# Patient Record
Sex: Female | Born: 1960 | Race: White | Hispanic: No | Marital: Married | Smoking: Never smoker
Health system: Southern US, Community
[De-identification: ages and names within clinical notes are randomized; demographics above are authoritative.]

## PROBLEM LIST (undated history)

## (undated) DIAGNOSIS — C4491 Basal cell carcinoma of skin, unspecified: Secondary | ICD-10-CM

## (undated) DIAGNOSIS — F32A Depression, unspecified: Secondary | ICD-10-CM

## (undated) DIAGNOSIS — D229 Melanocytic nevi, unspecified: Secondary | ICD-10-CM

## (undated) DIAGNOSIS — C4492 Squamous cell carcinoma of skin, unspecified: Secondary | ICD-10-CM

## (undated) DIAGNOSIS — F419 Anxiety disorder, unspecified: Secondary | ICD-10-CM

## (undated) DIAGNOSIS — C439 Malignant melanoma of skin, unspecified: Secondary | ICD-10-CM

## (undated) HISTORY — DX: Depression, unspecified: F32.A

## (undated) HISTORY — DX: Malignant melanoma of skin, unspecified: C43.9

## (undated) HISTORY — PX: TONSILLECTOMY: SUR1361

## (undated) HISTORY — DX: Basal cell carcinoma of skin, unspecified: C44.91

## (undated) HISTORY — DX: Squamous cell carcinoma of skin, unspecified: C44.92

## (undated) HISTORY — PX: KNEE ARTHROSCOPY W/ MENISCECTOMY: SHX1879

## (undated) HISTORY — DX: Anxiety disorder, unspecified: F41.9

## (undated) HISTORY — DX: Melanocytic nevi, unspecified: D22.9

## (undated) HISTORY — PX: MELANOMA EXCISION: SHX5266

---

## 1999-12-29 ENCOUNTER — Other Ambulatory Visit: Admission: RE | Admit: 1999-12-29 | Discharge: 1999-12-29 | Payer: Self-pay | Admitting: *Deleted

## 2001-02-05 ENCOUNTER — Other Ambulatory Visit: Admission: RE | Admit: 2001-02-05 | Discharge: 2001-02-05 | Payer: Self-pay | Admitting: Obstetrics and Gynecology

## 2002-03-24 DIAGNOSIS — C439 Malignant melanoma of skin, unspecified: Secondary | ICD-10-CM

## 2002-03-24 HISTORY — DX: Malignant melanoma of skin, unspecified: C43.9

## 2002-12-17 ENCOUNTER — Encounter: Admission: RE | Admit: 2002-12-17 | Discharge: 2003-01-08 | Payer: Self-pay | Admitting: Family Medicine

## 2003-07-29 DIAGNOSIS — D229 Melanocytic nevi, unspecified: Secondary | ICD-10-CM

## 2003-07-29 HISTORY — DX: Melanocytic nevi, unspecified: D22.9

## 2010-06-12 ENCOUNTER — Encounter: Payer: Self-pay | Admitting: Family Medicine

## 2013-11-11 DIAGNOSIS — C4492 Squamous cell carcinoma of skin, unspecified: Secondary | ICD-10-CM

## 2013-11-11 HISTORY — DX: Squamous cell carcinoma of skin, unspecified: C44.92

## 2016-01-14 DIAGNOSIS — C4491 Basal cell carcinoma of skin, unspecified: Secondary | ICD-10-CM

## 2016-01-14 HISTORY — DX: Basal cell carcinoma of skin, unspecified: C44.91

## 2016-05-23 DIAGNOSIS — F3160 Bipolar disorder, current episode mixed, unspecified: Secondary | ICD-10-CM | POA: Diagnosis not present

## 2016-05-26 DIAGNOSIS — L570 Actinic keratosis: Secondary | ICD-10-CM | POA: Diagnosis not present

## 2016-06-09 DIAGNOSIS — F4323 Adjustment disorder with mixed anxiety and depressed mood: Secondary | ICD-10-CM | POA: Diagnosis not present

## 2016-06-09 DIAGNOSIS — F332 Major depressive disorder, recurrent severe without psychotic features: Secondary | ICD-10-CM | POA: Diagnosis not present

## 2016-06-12 DIAGNOSIS — F332 Major depressive disorder, recurrent severe without psychotic features: Secondary | ICD-10-CM | POA: Diagnosis not present

## 2016-06-26 DIAGNOSIS — F332 Major depressive disorder, recurrent severe without psychotic features: Secondary | ICD-10-CM | POA: Diagnosis not present

## 2016-06-30 DIAGNOSIS — L910 Hypertrophic scar: Secondary | ICD-10-CM | POA: Diagnosis not present

## 2016-06-30 DIAGNOSIS — Z85820 Personal history of malignant melanoma of skin: Secondary | ICD-10-CM | POA: Diagnosis not present

## 2016-06-30 DIAGNOSIS — Z85828 Personal history of other malignant neoplasm of skin: Secondary | ICD-10-CM | POA: Diagnosis not present

## 2016-06-30 DIAGNOSIS — L570 Actinic keratosis: Secondary | ICD-10-CM | POA: Diagnosis not present

## 2016-07-20 DIAGNOSIS — F332 Major depressive disorder, recurrent severe without psychotic features: Secondary | ICD-10-CM | POA: Diagnosis not present

## 2016-07-26 DIAGNOSIS — F332 Major depressive disorder, recurrent severe without psychotic features: Secondary | ICD-10-CM | POA: Diagnosis not present

## 2016-09-15 DIAGNOSIS — F332 Major depressive disorder, recurrent severe without psychotic features: Secondary | ICD-10-CM | POA: Diagnosis not present

## 2017-01-17 DIAGNOSIS — Z01419 Encounter for gynecological examination (general) (routine) without abnormal findings: Secondary | ICD-10-CM | POA: Diagnosis not present

## 2017-02-06 DIAGNOSIS — F332 Major depressive disorder, recurrent severe without psychotic features: Secondary | ICD-10-CM | POA: Diagnosis not present

## 2017-02-08 DIAGNOSIS — F332 Major depressive disorder, recurrent severe without psychotic features: Secondary | ICD-10-CM | POA: Diagnosis not present

## 2017-02-09 DIAGNOSIS — F332 Major depressive disorder, recurrent severe without psychotic features: Secondary | ICD-10-CM | POA: Diagnosis not present

## 2017-02-14 DIAGNOSIS — F332 Major depressive disorder, recurrent severe without psychotic features: Secondary | ICD-10-CM | POA: Diagnosis not present

## 2017-03-23 DIAGNOSIS — F332 Major depressive disorder, recurrent severe without psychotic features: Secondary | ICD-10-CM | POA: Diagnosis not present

## 2017-04-02 DIAGNOSIS — F332 Major depressive disorder, recurrent severe without psychotic features: Secondary | ICD-10-CM | POA: Diagnosis not present

## 2017-04-25 DIAGNOSIS — F332 Major depressive disorder, recurrent severe without psychotic features: Secondary | ICD-10-CM | POA: Diagnosis not present

## 2017-06-20 DIAGNOSIS — F332 Major depressive disorder, recurrent severe without psychotic features: Secondary | ICD-10-CM | POA: Diagnosis not present

## 2017-08-07 DIAGNOSIS — L570 Actinic keratosis: Secondary | ICD-10-CM | POA: Diagnosis not present

## 2017-08-07 DIAGNOSIS — D229 Melanocytic nevi, unspecified: Secondary | ICD-10-CM | POA: Diagnosis not present

## 2017-08-07 DIAGNOSIS — Z85820 Personal history of malignant melanoma of skin: Secondary | ICD-10-CM | POA: Diagnosis not present

## 2017-08-21 DIAGNOSIS — F410 Panic disorder [episodic paroxysmal anxiety] without agoraphobia: Secondary | ICD-10-CM | POA: Diagnosis not present

## 2017-08-22 DIAGNOSIS — F410 Panic disorder [episodic paroxysmal anxiety] without agoraphobia: Secondary | ICD-10-CM | POA: Diagnosis not present

## 2017-10-09 DIAGNOSIS — H524 Presbyopia: Secondary | ICD-10-CM | POA: Diagnosis not present

## 2017-10-10 ENCOUNTER — Other Ambulatory Visit: Payer: Self-pay

## 2017-10-10 ENCOUNTER — Encounter (HOSPITAL_COMMUNITY): Payer: Self-pay | Admitting: Emergency Medicine

## 2017-10-10 ENCOUNTER — Emergency Department (HOSPITAL_COMMUNITY)
Admission: EM | Admit: 2017-10-10 | Discharge: 2017-10-10 | Disposition: A | Discharge status: Home / Self Care | Payer: BLUE CROSS/BLUE SHIELD | Attending: Emergency Medicine | Admitting: Emergency Medicine

## 2017-10-10 ENCOUNTER — Emergency Department (HOSPITAL_COMMUNITY): Payer: BLUE CROSS/BLUE SHIELD

## 2017-10-10 DIAGNOSIS — Z79899 Other long term (current) drug therapy: Secondary | ICD-10-CM | POA: Diagnosis not present

## 2017-10-10 DIAGNOSIS — M545 Low back pain, unspecified: Secondary | ICD-10-CM

## 2017-10-10 DIAGNOSIS — R109 Unspecified abdominal pain: Secondary | ICD-10-CM | POA: Diagnosis not present

## 2017-10-10 DIAGNOSIS — R1030 Lower abdominal pain, unspecified: Secondary | ICD-10-CM | POA: Diagnosis not present

## 2017-10-10 DIAGNOSIS — R1031 Right lower quadrant pain: Secondary | ICD-10-CM | POA: Insufficient documentation

## 2017-10-10 LAB — BASIC METABOLIC PANEL
ANION GAP: 10 (ref 5–15)
BUN: 21 mg/dL — AB (ref 6–20)
CHLORIDE: 104 mmol/L (ref 101–111)
CO2: 25 mmol/L (ref 22–32)
Calcium: 9.5 mg/dL (ref 8.9–10.3)
Creatinine, Ser: 0.89 mg/dL (ref 0.44–1.00)
GFR calc Af Amer: >60 mL/min (ref >60)
GFR calc non Af Amer: >60 mL/min (ref >60)
GLUCOSE: 105 mg/dL — AB (ref 65–99)
POTASSIUM: 4.1 mmol/L (ref 3.5–5.1)
SODIUM: 139 mmol/L (ref 135–145)

## 2017-10-10 LAB — URINALYSIS, ROUTINE W REFLEX MICROSCOPIC
Bilirubin Urine: NEGATIVE
GLUCOSE, UA: NEGATIVE mg/dL
Hgb urine dipstick: NEGATIVE
Ketones, ur: 20 mg/dL — AB
LEUKOCYTES UA: NEGATIVE
Nitrite: NEGATIVE
PROTEIN: NEGATIVE mg/dL
SPECIFIC GRAVITY, URINE: 1.024 (ref 1.005–1.030)
pH: 5 (ref 5.0–8.0)

## 2017-10-10 LAB — CBC
HEMATOCRIT: 40.6 % (ref 36.0–46.0)
HEMOGLOBIN: 14 g/dL (ref 12.0–15.0)
MCH: 29.9 pg (ref 26.0–34.0)
MCHC: 34.5 g/dL (ref 30.0–36.0)
MCV: 86.8 fL (ref 78.0–100.0)
Platelets: 221 10*3/uL (ref 150–400)
RBC: 4.68 MIL/uL (ref 3.87–5.11)
RDW: 12.4 % (ref 11.5–15.5)
WBC: 8.4 10*3/uL (ref 4.0–10.5)

## 2017-10-10 LAB — HEPATIC FUNCTION PANEL
ALT: 19 U/L (ref 14–54)
AST: 20 U/L (ref 15–41)
Albumin: 4.4 g/dL (ref 3.5–5.0)
Alkaline Phosphatase: 53 U/L (ref 38–126)
Bilirubin, Direct: <0.1 mg/dL — ABNORMAL LOW (ref 0.1–0.5)
Total Bilirubin: 0.9 mg/dL (ref 0.3–1.2)
Total Protein: 7.1 g/dL (ref 6.5–8.1)

## 2017-10-10 LAB — LIPASE, BLOOD: Lipase: 25 U/L (ref 11–51)

## 2017-10-10 MED ORDER — KETOROLAC TROMETHAMINE 30 MG/ML IJ SOLN
30.0000 mg | Freq: Once | INTRAMUSCULAR | Status: AC
  Administered 2017-10-10: 30 mg via INTRAVENOUS
  Filled 2017-10-10: qty 1

## 2017-10-10 MED ORDER — IOPAMIDOL (ISOVUE-300) INJECTION 61%
100.0000 mL | Freq: Once | INTRAVENOUS | Status: AC | PRN
  Administered 2017-10-10: 100 mL via INTRAVENOUS

## 2017-10-10 MED ORDER — IOPAMIDOL (ISOVUE-300) INJECTION 61%
INTRAVENOUS | Status: AC
  Filled 2017-10-10: qty 100

## 2017-10-10 MED ORDER — IBUPROFEN 800 MG PO TABS: 800.0000 mg | ORAL_TABLET | Freq: Three times a day (TID) | ORAL | 0 refills | Status: DC | PRN

## 2017-10-10 MED ORDER — CYCLOBENZAPRINE HCL 10 MG PO TABS: 10.0000 mg | ORAL_TABLET | Freq: Every day | ORAL | 0 refills | Status: DC

## 2017-10-10 MED ORDER — TRAMADOL HCL 50 MG PO TABS: 50.0000 mg | ORAL_TABLET | Freq: Four times a day (QID) | ORAL | 0 refills | Status: DC | PRN

## 2017-10-10 NOTE — ED Provider Notes (Signed)
[facility] DEPT Provider Note   CSN: [id number] Arrival date & time: 10/10/17  1223     History   Chief Complaint Chief Complaint  Patient presents with  . Flank Pain    HPI Sheri Holland is a 57 y.o. female.  HPI Patient presents to the emergency department with right lower back and flank pain.  The patient states that the pain started last night and got worse throughout the evening and into the day today.  The patient states she took a muscle relaxant and a hydrocodone with some relief of her symptoms.  The patient denies chest pain, shortness of breath, headache,blurred vision, neck pain, fever, cough, weakness, numbness, dizziness, anorexia, edema, abdominal pain, nausea, vomiting, diarrhea, rash, dysuria, hematemesis, bloody stool, near syncope, or syncope. History reviewed. No pertinent past medical history.  There are no active problems to display for this patient.   History reviewed. No pertinent surgical history.   OB History   None      Home Medications    Prior to Admission medications   Medication Sig Start Date End Date Taking? Authorizing Provider  Multiple Vitamins-Calcium (ONE-A-DAY WOMENS PO) Take 1 tablet by mouth daily.   Yes [provider]  Omega-3 Fatty Acids (FISH OIL PO) Take 1 capsule by mouth daily.   Yes [provider]  venlafaxine XR (EFFEXOR-XR) 75 MG 24 hr capsule Take 75 mg by mouth daily. 09/10/17  Yes [provider]  LORazepam (ATIVAN) 0.5 MG tablet Take 0.25-0.5 tablets by mouth daily as needed for anxiety.  09/10/17   [provider]    Family History No family history on file.  Social History Social History   Tobacco Use  . Smoking status: Not on file  Substance Use Topics  . Alcohol use: Not on file  . Drug use: Not on file     Allergies   Patient has no known allergies.   Review of Systems Review of Systems All other systems negative except as  documented in the HPI. All pertinent positives and negatives as reviewed in the HPI.  Physical Exam Updated Vital Signs BP 131/79 (BP Location: Right Arm)   Pulse (!) 48   Temp 98 F (36.7 C) (Oral)   Resp 16   SpO2 100%   Physical Exam  Constitutional: She is oriented to person, place, and time. She appears well-developed and well-nourished. No distress.  HENT:  Head: Normocephalic and atraumatic.  Mouth/Throat: Oropharynx is clear and moist.  Eyes: Pupils are equal, round, and reactive to light.  Neck: Normal range of motion. Neck supple.  Cardiovascular: Normal rate, regular rhythm and normal heart sounds. Exam reveals no gallop and no friction rub.  No murmur heard. Pulmonary/Chest: Effort normal and breath sounds normal. No respiratory distress. She has no wheezes.  Abdominal: Soft. Bowel sounds are normal. She exhibits no distension and no mass. There is tenderness. There is no rebound and no guarding.    Neurological: She is alert and oriented to person, place, and time. She exhibits normal muscle tone. Coordination normal.  Skin: Skin is warm and dry. Capillary refill takes less than 2 seconds. No rash noted. No erythema.  Psychiatric: She has a normal mood and affect. Her behavior is normal.  Nursing note and vitals reviewed.    ED Treatments / Results  Labs (all labs ordered are listed, but only abnormal results are displayed) Labs Reviewed  URINALYSIS, ROUTINE W REFLEX MICROSCOPIC - Abnormal; Notable for the following  components:      Result Value   Ketones, ur 20 (*)    All other components within normal limits  BASIC METABOLIC PANEL - Abnormal; Notable for the following components:   Glucose, Bld 105 (*)    BUN 21 (*)    All other components within normal limits  HEPATIC FUNCTION PANEL - Abnormal; Notable for the following components:   Bilirubin, Direct <0.1 (*)    All other components within normal limits  CBC  LIPASE, BLOOD     EKG None  Radiology Ct Abdomen Pelvis W Contrast  Result Date: 10/10/2017 CLINICAL DATA:  Right-sided flank pain. EXAM: CT ABDOMEN AND PELVIS WITH CONTRAST TECHNIQUE: Multidetector CT imaging of the abdomen and pelvis was performed using the standard protocol following bolus administration of intravenous contrast. CONTRAST:  168mL ISOVUE-300 IOPAMIDOL (ISOVUE-300) INJECTION 61% COMPARISON:  None. FINDINGS: Lower chest: No acute abnormality. Hepatobiliary: No focal liver abnormality is seen. No gallstones, gallbladder wall thickening, or biliary dilatation. Pancreas: Unremarkable. No pancreatic ductal dilatation or surrounding inflammatory changes. Spleen: Normal in size without focal abnormality. Adrenals/Urinary Tract: Adrenal glands are unremarkable. Kidneys are normal, without renal calculi, focal lesion, or hydronephrosis. Bladder is unremarkable. Stomach/Bowel: Bowel shows no evidence of obstruction or inflammation. No bowel lesions identified. No free air or abscess. Vascular/Lymphatic: No significant vascular findings are present. No enlarged abdominal or pelvic lymph nodes. Reproductive: Uterus and bilateral adnexa are unremarkable. There are prominent pelvic varicosities in the left pelvis and adnexal region that appear to communicate with an enlarged left ovarian vein. This is nonspecific but can be indicative of incompetence of the left ovarian vein and can be a cause of chronic pelvic pain due to pelvic varicosities. Other: No abdominal wall hernia or abnormality. No abdominopelvic ascites. Musculoskeletal: No acute or significant osseous findings. IMPRESSION: 1. No acute findings. 2. 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 Electronically Signed   By: Aletta Edouard M.D.   On: 10/10/2017 17:57    Procedures Procedures (including critical care time)  Medications Ordered in ED Medications  iopamidol (ISOVUE-300)  61 % injection 100 mL (100 mLs Intravenous Contrast Given 10/10/17 1735)     Initial Impression / Assessment and Plan / ED Course  I have reviewed the triage vital signs and the nursing notes.  Pertinent labs & imaging results that were available during my care of the patient were reviewed by me and considered in my medical decision making (see chart for details).     Patient will be treated for lower back discomfort.  I explained to the patient this is something that could be evolving and is yet to fully declare itself on the CT scan.  The patient is advised to return for any worsening in her condition patient agrees the plan and all questions were answered. Final Clinical Impressions(s) / ED Diagnoses   Final diagnoses:  None    ED Discharge Orders    None       Rebeca Allegra 10/10/17 Rowan Blase, MD 10/11/17 [phone]

## 2017-10-10 NOTE — Discharge Instructions (Addendum)
He can use ice and heat on your lower back.  Return here for any worsening in your condition as this could be an evolving process that is yet to fully declare itself.

## 2017-10-10 NOTE — ED Triage Notes (Signed)
Pt complaint of right flank pain onset last night; denies GU symptoms; denies radiating down leg.

## 2017-10-10 NOTE — ED Notes (Signed)
1x unsuccessful IV attempt

## 2017-10-22 DIAGNOSIS — M9903 Segmental and somatic dysfunction of lumbar region: Secondary | ICD-10-CM | POA: Diagnosis not present

## 2017-10-22 DIAGNOSIS — M9905 Segmental and somatic dysfunction of pelvic region: Secondary | ICD-10-CM | POA: Diagnosis not present

## 2017-10-22 DIAGNOSIS — M9904 Segmental and somatic dysfunction of sacral region: Secondary | ICD-10-CM | POA: Diagnosis not present

## 2017-10-22 DIAGNOSIS — S332XXA Dislocation of sacroiliac and sacrococcygeal joint, initial encounter: Secondary | ICD-10-CM | POA: Diagnosis not present

## 2017-10-29 DIAGNOSIS — M9903 Segmental and somatic dysfunction of lumbar region: Secondary | ICD-10-CM | POA: Diagnosis not present

## 2017-10-29 DIAGNOSIS — M9905 Segmental and somatic dysfunction of pelvic region: Secondary | ICD-10-CM | POA: Diagnosis not present

## 2017-10-29 DIAGNOSIS — S332XXA Dislocation of sacroiliac and sacrococcygeal joint, initial encounter: Secondary | ICD-10-CM | POA: Diagnosis not present

## 2017-10-29 DIAGNOSIS — M9904 Segmental and somatic dysfunction of sacral region: Secondary | ICD-10-CM | POA: Diagnosis not present

## 2017-11-01 DIAGNOSIS — S332XXA Dislocation of sacroiliac and sacrococcygeal joint, initial encounter: Secondary | ICD-10-CM | POA: Diagnosis not present

## 2017-11-01 DIAGNOSIS — M9905 Segmental and somatic dysfunction of pelvic region: Secondary | ICD-10-CM | POA: Diagnosis not present

## 2017-11-01 DIAGNOSIS — M9903 Segmental and somatic dysfunction of lumbar region: Secondary | ICD-10-CM | POA: Diagnosis not present

## 2017-11-01 DIAGNOSIS — M9904 Segmental and somatic dysfunction of sacral region: Secondary | ICD-10-CM | POA: Diagnosis not present

## 2018-02-07 DIAGNOSIS — F410 Panic disorder [episodic paroxysmal anxiety] without agoraphobia: Secondary | ICD-10-CM | POA: Diagnosis not present

## 2018-02-08 DIAGNOSIS — Z01419 Encounter for gynecological examination (general) (routine) without abnormal findings: Secondary | ICD-10-CM | POA: Diagnosis not present

## 2018-02-08 DIAGNOSIS — Z1231 Encounter for screening mammogram for malignant neoplasm of breast: Secondary | ICD-10-CM | POA: Diagnosis not present

## 2018-02-08 DIAGNOSIS — Z6823 Body mass index (BMI) 23.0-23.9, adult: Secondary | ICD-10-CM | POA: Diagnosis not present

## 2018-02-20 ENCOUNTER — Ambulatory Visit: Payer: Self-pay | Admitting: Mental Health

## 2018-04-09 ENCOUNTER — Other Ambulatory Visit: Payer: Self-pay

## 2018-04-09 MED ORDER — VENLAFAXINE HCL ER 75 MG PO CP24: 75.0000 mg | ORAL_CAPSULE | Freq: Every day | ORAL | 1 refills | Status: DC

## 2018-05-23 DIAGNOSIS — M25561 Pain in right knee: Secondary | ICD-10-CM | POA: Diagnosis not present

## 2018-05-23 DIAGNOSIS — G8929 Other chronic pain: Secondary | ICD-10-CM | POA: Diagnosis not present

## 2018-05-23 DIAGNOSIS — M2391 Unspecified internal derangement of right knee: Secondary | ICD-10-CM | POA: Diagnosis not present

## 2018-05-24 ENCOUNTER — Other Ambulatory Visit: Payer: Self-pay | Admitting: Orthopedic Surgery

## 2018-05-24 DIAGNOSIS — M25561 Pain in right knee: Secondary | ICD-10-CM

## 2018-06-03 ENCOUNTER — Ambulatory Visit
Admission: RE | Admit: 2018-06-03 | Discharge: 2018-06-03 | Disposition: A | Discharge status: Home / Self Care | Payer: BLUE CROSS/BLUE SHIELD | Source: Ambulatory Visit | Attending: Orthopedic Surgery | Admitting: Orthopedic Surgery

## 2018-06-03 DIAGNOSIS — S83241A Other tear of medial meniscus, current injury, right knee, initial encounter: Secondary | ICD-10-CM | POA: Diagnosis not present

## 2018-06-03 DIAGNOSIS — M25561 Pain in right knee: Secondary | ICD-10-CM

## 2018-06-04 DIAGNOSIS — S83241A Other tear of medial meniscus, current injury, right knee, initial encounter: Secondary | ICD-10-CM | POA: Diagnosis not present

## 2018-06-04 DIAGNOSIS — G8929 Other chronic pain: Secondary | ICD-10-CM | POA: Diagnosis not present

## 2018-07-03 DIAGNOSIS — S83231A Complex tear of medial meniscus, current injury, right knee, initial encounter: Secondary | ICD-10-CM | POA: Diagnosis not present

## 2018-07-03 DIAGNOSIS — G8918 Other acute postprocedural pain: Secondary | ICD-10-CM | POA: Diagnosis not present

## 2018-07-03 DIAGNOSIS — X58XXXA Exposure to other specified factors, initial encounter: Secondary | ICD-10-CM | POA: Diagnosis not present

## 2018-07-03 DIAGNOSIS — Y999 Unspecified external cause status: Secondary | ICD-10-CM | POA: Diagnosis not present

## 2018-07-03 DIAGNOSIS — M94261 Chondromalacia, right knee: Secondary | ICD-10-CM | POA: Diagnosis not present

## 2018-07-03 DIAGNOSIS — M659 Synovitis and tenosynovitis, unspecified: Secondary | ICD-10-CM | POA: Diagnosis not present

## 2018-07-03 DIAGNOSIS — M6751 Plica syndrome, right knee: Secondary | ICD-10-CM | POA: Diagnosis not present

## 2018-07-09 DIAGNOSIS — M25561 Pain in right knee: Secondary | ICD-10-CM | POA: Diagnosis not present

## 2018-07-09 DIAGNOSIS — R262 Difficulty in walking, not elsewhere classified: Secondary | ICD-10-CM | POA: Diagnosis not present

## 2018-07-09 DIAGNOSIS — S83231A Complex tear of medial meniscus, current injury, right knee, initial encounter: Secondary | ICD-10-CM | POA: Diagnosis not present

## 2018-07-09 DIAGNOSIS — Z7409 Other reduced mobility: Secondary | ICD-10-CM | POA: Diagnosis not present

## 2018-07-18 DIAGNOSIS — Z7409 Other reduced mobility: Secondary | ICD-10-CM | POA: Diagnosis not present

## 2018-07-18 DIAGNOSIS — M25561 Pain in right knee: Secondary | ICD-10-CM | POA: Diagnosis not present

## 2018-07-18 DIAGNOSIS — S83231A Complex tear of medial meniscus, current injury, right knee, initial encounter: Secondary | ICD-10-CM | POA: Diagnosis not present

## 2018-07-18 DIAGNOSIS — R262 Difficulty in walking, not elsewhere classified: Secondary | ICD-10-CM | POA: Diagnosis not present

## 2018-07-23 DIAGNOSIS — D229 Melanocytic nevi, unspecified: Secondary | ICD-10-CM | POA: Diagnosis not present

## 2018-07-23 DIAGNOSIS — L570 Actinic keratosis: Secondary | ICD-10-CM | POA: Diagnosis not present

## 2018-07-25 ENCOUNTER — Ambulatory Visit: Payer: Self-pay | Admitting: Psychiatry

## 2018-07-30 DIAGNOSIS — C4372 Malignant melanoma of left lower limb, including hip: Secondary | ICD-10-CM | POA: Diagnosis not present

## 2018-07-30 DIAGNOSIS — Z9889 Other specified postprocedural states: Secondary | ICD-10-CM | POA: Diagnosis not present

## 2018-07-30 DIAGNOSIS — F330 Major depressive disorder, recurrent, mild: Secondary | ICD-10-CM | POA: Diagnosis not present

## 2018-07-30 DIAGNOSIS — Z0000 Encounter for general adult medical examination without abnormal findings: Secondary | ICD-10-CM | POA: Diagnosis not present

## 2018-07-31 DIAGNOSIS — M25561 Pain in right knee: Secondary | ICD-10-CM | POA: Diagnosis not present

## 2018-07-31 DIAGNOSIS — S83231A Complex tear of medial meniscus, current injury, right knee, initial encounter: Secondary | ICD-10-CM | POA: Diagnosis not present

## 2018-07-31 DIAGNOSIS — Z7409 Other reduced mobility: Secondary | ICD-10-CM | POA: Diagnosis not present

## 2018-07-31 DIAGNOSIS — R262 Difficulty in walking, not elsewhere classified: Secondary | ICD-10-CM | POA: Diagnosis not present

## 2018-08-20 ENCOUNTER — Encounter: Payer: Self-pay | Admitting: *Deleted

## 2018-08-28 ENCOUNTER — Ambulatory Visit: Payer: Self-pay | Admitting: Psychiatry

## 2018-09-13 IMAGING — CT CT ABD-PELV W/ CM
2 of 5 series · 16 of 46 positions shown, 18 images · IV contrast (iopamidol)
Comparison: None.

CLINICAL DATA: Right-sided flank pain.

EXAM:
CT ABDOMEN AND PELVIS WITH CONTRAST
TECHNIQUE: Multidetector CT imaging of the abdomen and pelvis was performed
using the standard protocol following bolus administration of
intravenous contrast.
CONTRAST:  100mL JVZADG-4JJ IOPAMIDOL (JVZADG-4JJ) INJECTION 61%

[Series 2: axial st · axial · 0.76mm/px · z∈[-732,-377]mm · 13 of 83 slices shown, 15 images]
[im 6/83  soft-tissue]
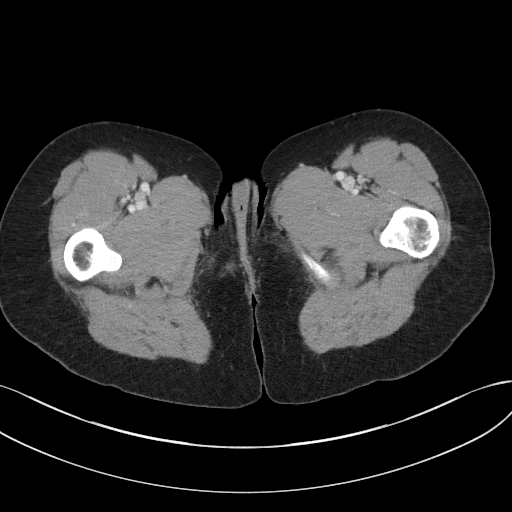
[im 6/83  bone]
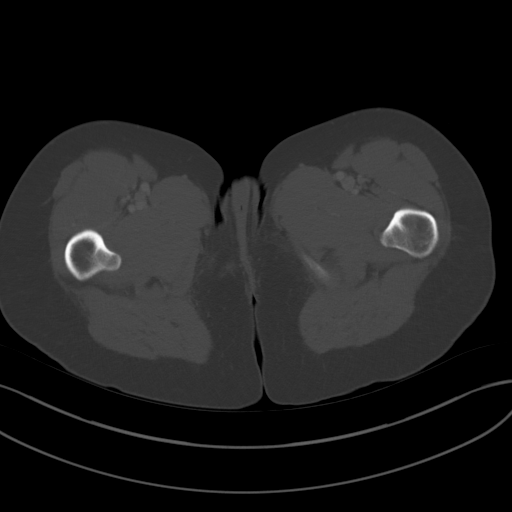
[im 12/83  soft-tissue]
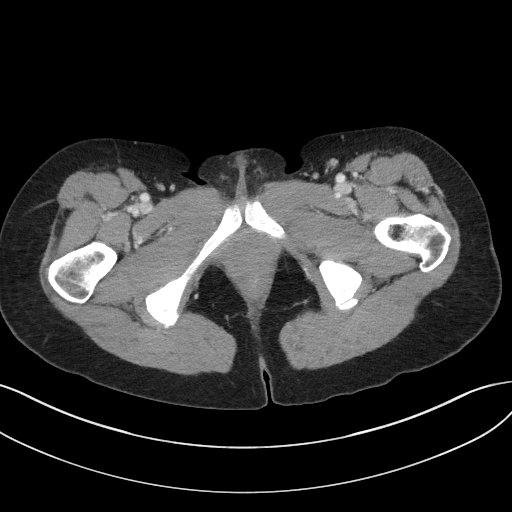
[im 18/83  soft-tissue]
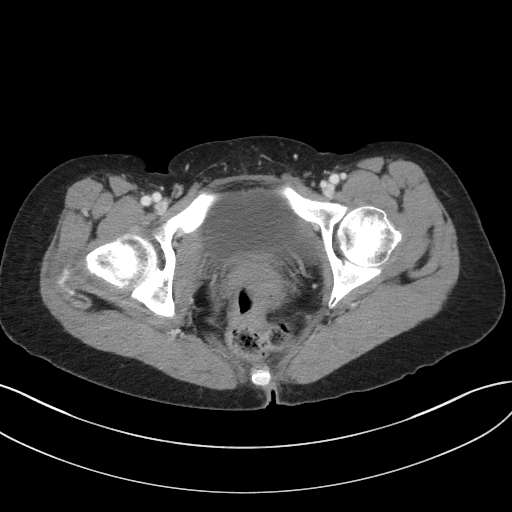
[im 24/83  soft-tissue]
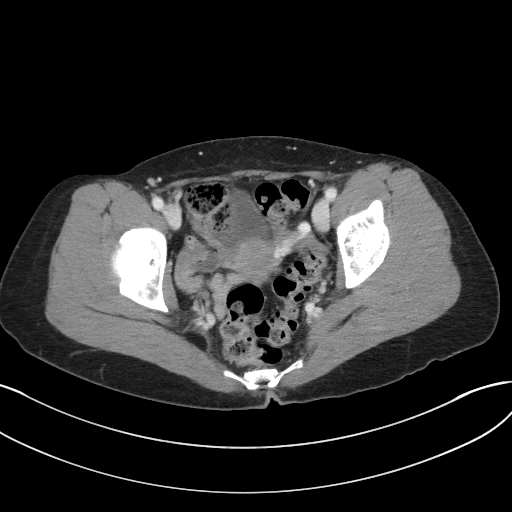
[im 30/83  soft-tissue]
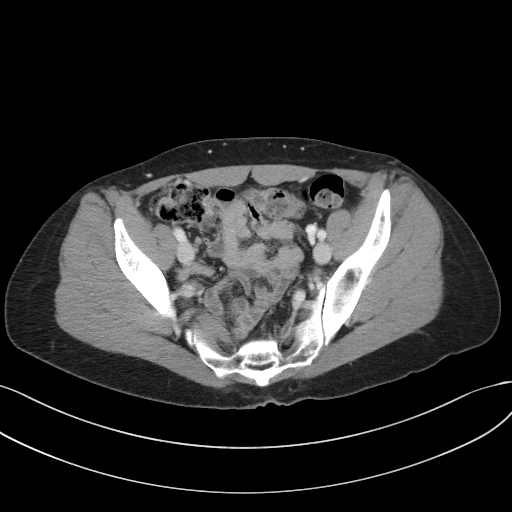
[im 36/83  soft-tissue]
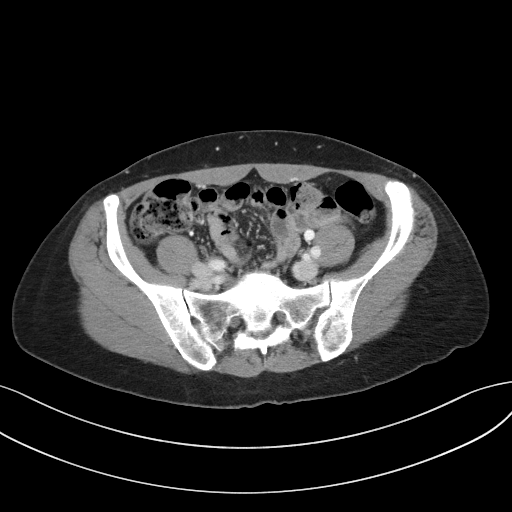
[im 42/83  soft-tissue]
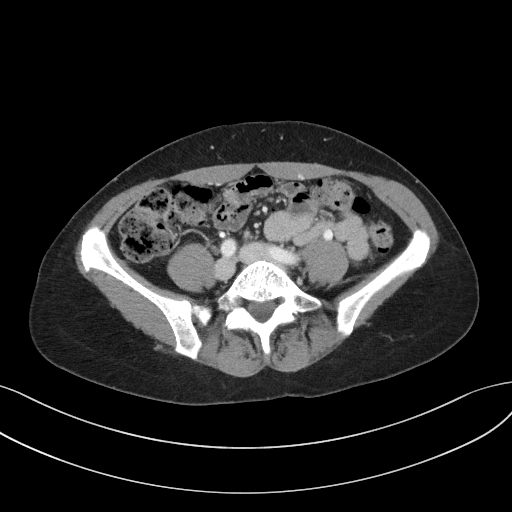
[im 47/83  soft-tissue]
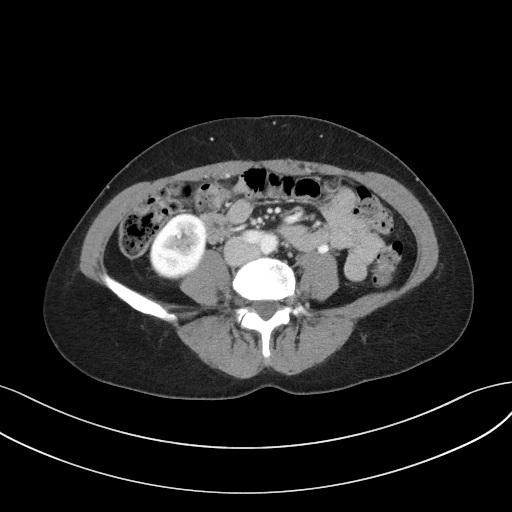
[im 53/83  soft-tissue]
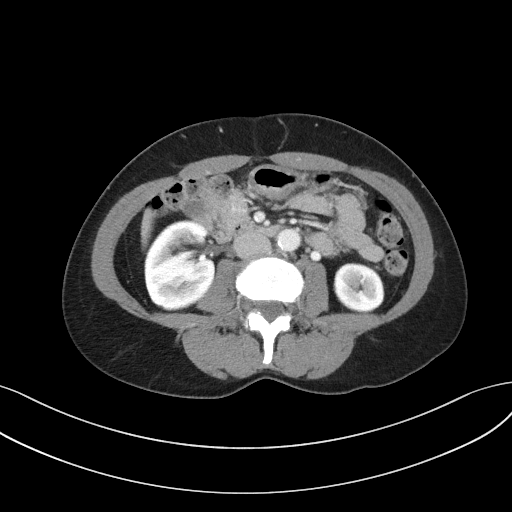
[im 53/83  bone]
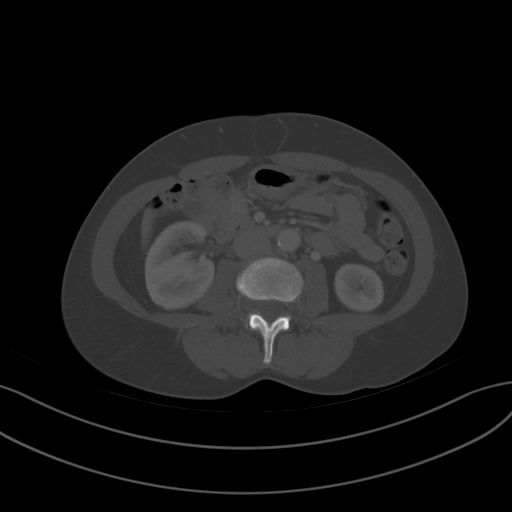
[im 59/83  soft-tissue]
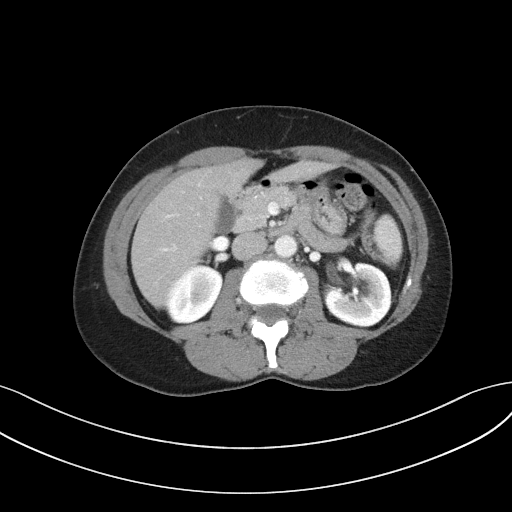
[im 65/83  soft-tissue]
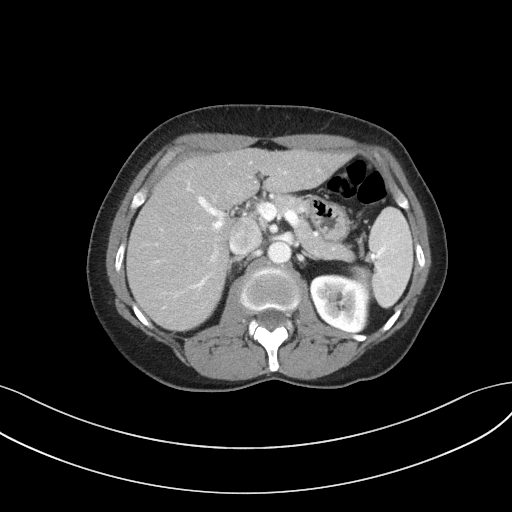
[im 71/83  soft-tissue]
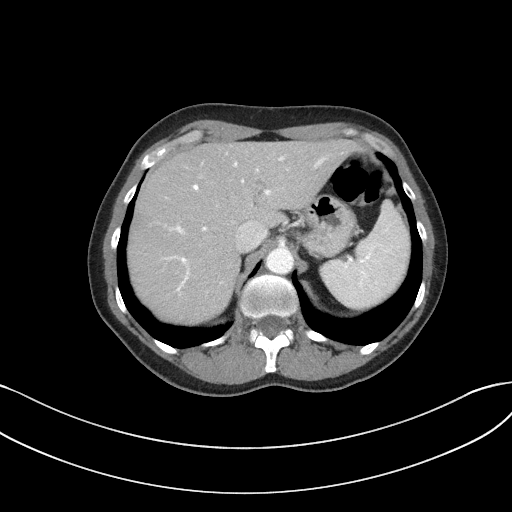
[im 77/83  soft-tissue]
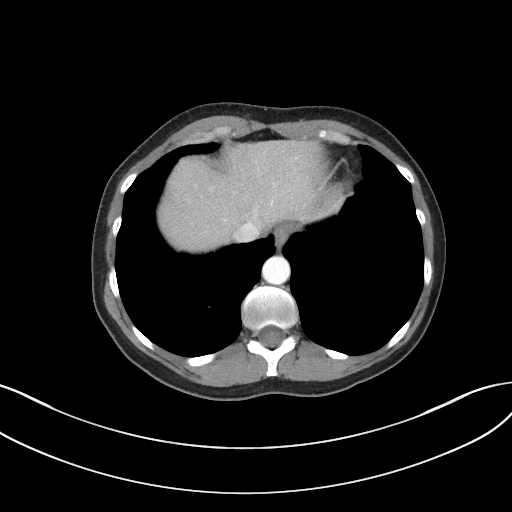

[Series 5: coronal st · coronal · 0.68mm/px · 3 of 101 slices shown]
[im 34/101  soft-tissue]
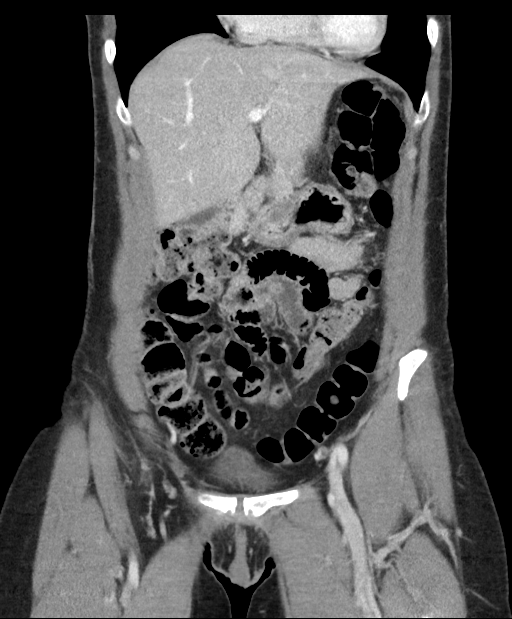
[im 45/101  soft-tissue]
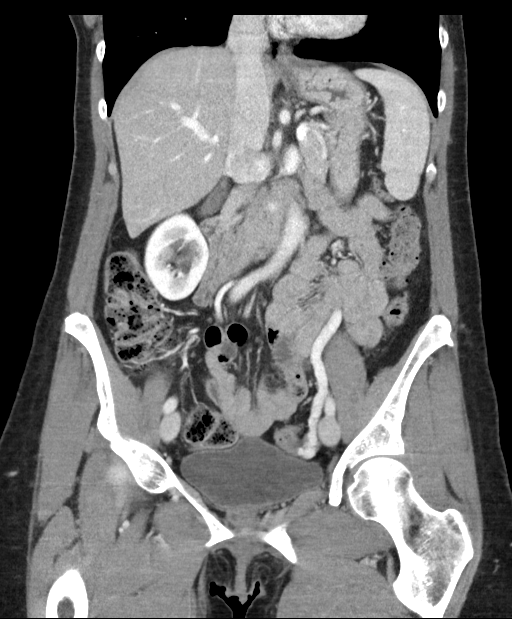
[im 56/101  soft-tissue]
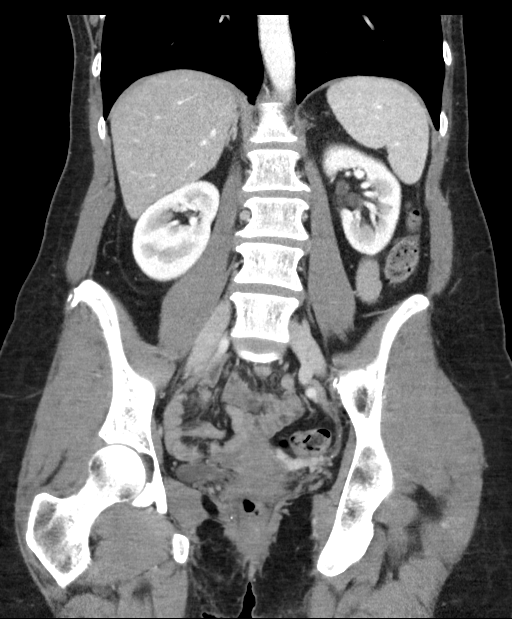

[16 of 46 positions shown; findings below may reference images not displayed]

FINDINGS: Lower chest: No acute abnormality.

Hepatobiliary: No focal liver abnormality is seen. No gallstones,
gallbladder wall thickening, or biliary dilatation.

Pancreas: Unremarkable. No pancreatic ductal dilatation or
surrounding inflammatory changes.

Spleen: Normal in size without focal abnormality.

Adrenals/Urinary Tract: Adrenal glands are unremarkable. Kidneys are
normal, without renal calculi, focal lesion, or hydronephrosis.
Bladder is unremarkable.

Stomach/Bowel: Bowel shows no evidence of obstruction or
inflammation. No bowel lesions identified. No free air or abscess.

Vascular/Lymphatic: No significant vascular findings are present. No
enlarged abdominal or pelvic lymph nodes.

Reproductive: Uterus and bilateral adnexa are unremarkable. There
are prominent pelvic varicosities in the left pelvis and adnexal
region that appear to communicate with an enlarged left ovarian
vein. This is nonspecific but can be indicative of incompetence of
the left ovarian vein and can be a cause of chronic pelvic pain due
to pelvic varicosities.

Other: No abdominal wall hernia or abnormality. No abdominopelvic
ascites.

Musculoskeletal: No acute or significant osseous findings.
IMPRESSION: 1. No acute findings.
2. Prominent left pelvic veins with enlarged left ovarian vein can
be indicative of left ovarian vein incompetence.

## 2018-09-19 ENCOUNTER — Encounter: Payer: Self-pay | Admitting: Psychiatry

## 2018-09-19 ENCOUNTER — Ambulatory Visit (INDEPENDENT_AMBULATORY_CARE_PROVIDER_SITE_OTHER): Payer: BLUE CROSS/BLUE SHIELD | Admitting: Psychiatry

## 2018-09-19 ENCOUNTER — Other Ambulatory Visit: Payer: Self-pay

## 2018-09-19 DIAGNOSIS — F330 Major depressive disorder, recurrent, mild: Secondary | ICD-10-CM

## 2018-09-19 DIAGNOSIS — F411 Generalized anxiety disorder: Secondary | ICD-10-CM | POA: Diagnosis not present

## 2018-09-19 MED ORDER — VENLAFAXINE HCL ER 75 MG PO CP24: 75.0000 mg | ORAL_CAPSULE | Freq: Every day | ORAL | 2 refills | Status: DC

## 2018-09-19 NOTE — Progress Notes (Signed)
Sheri Holland [id number] 1961/03/19 58 y.o.  Virtual Visit via Video Note  I connected with@ on 09/19/18 at  1:30 PM EDT by a video enabled telemedicine application and verified that I am speaking with the correct person using two identifiers.   I discussed the limitations of evaluation and management by telemedicine and the availability of in person appointments. The patient expressed understanding and agreed to proceed.  I discussed the assessment and treatment plan with the patient. The patient was provided an opportunity to ask questions and all were answered. The patient agreed with the plan and demonstrated an understanding of the instructions.   The patient was advised to call back or seek an in-person evaluation if the symptoms worsen or if the condition fails to improve as anticipated.  I provided 30 minutes of non-face-to-face time during this encounter.  The patient was located at home.  The provider was located at home.   Thayer Headings, PMHNP   Subjective:   Patient ID:  Sheri Holland is a 58 y.o. (DOB Dec 15, 1960) female.  Chief Complaint:  Chief Complaint  Patient presents with  . Follow-up    h/o anxiety and depression    HPI Sheri Holland presents to the office today for follow-up of depression and anxiety. She reports that she is "up and down." She reports that she has been taking Effexor XR 75 mg po qd and that this is effective for her. She reports some worry and rumination. She reports feelings of guilt and shame.   She reports that if she misses a dose of Effexor XR she has some vivid dreams. She reports that overall her mood is "ok." She reports that she continues to have some psychosocial stress and is reading some materials about boundaries. She reports that her anxiety is manageable and occasionally and that Ativan is typically effective. She reports that when she has increased anxiety it is in response to identifiable stressors. She denies any recent  panic attacks. She reports that she has been sleeping ok. She reports vivid dreams and is occ yelling out in her sleep. Energy and motivation have been good. Appetite has been good. She reports adequate concentration. She reports that work has been going well and she is now working completely from home. Denies SI.    Review of Systems:  Review of Systems  Musculoskeletal: Negative for gait problem.  Neurological: Negative for tremors.  Psychiatric/Behavioral:       Please refer to HPI    Medications: I have reviewed the patient's current medications.  Current Outpatient Medications  Medication Sig Dispense Refill  . ibuprofen (ADVIL,MOTRIN) 800 MG tablet Take 1 tablet (800 mg total) by mouth every 8 (eight) hours as needed. 21 tablet 0  . LORazepam (ATIVAN) 0.5 MG tablet Take 0.25-0.5 tablets by mouth daily as needed for anxiety.   2  . venlafaxine XR (EFFEXOR-XR) 75 MG 24 hr capsule Take 1 capsule (75 mg total) by mouth daily. 90 capsule 2  . cyclobenzaprine (FLEXERIL) 10 MG tablet Take 1 tablet (10 mg total) by mouth at bedtime. (Patient not taking: Reported on 09/19/2018) 10 tablet 0  . Multiple Vitamins-Calcium (ONE-A-DAY WOMENS PO) Take 1 tablet by mouth daily.    . Omega-3 Fatty Acids (FISH OIL PO) Take 1 capsule by mouth daily.    . traMADol (ULTRAM) 50 MG tablet Take 1 tablet (50 mg total) by mouth every 6 (six) hours as needed for severe pain. (Patient not taking: Reported on 09/19/2018) 15  tablet 0   No current facility-administered medications for this visit.     Medication Side Effects: Other: Reports vivid dreams.   Allergies: No Known Allergies  Past Medical History:  Diagnosis Date  . 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 BCC (basal cell carcinoma) nodular 01/14/2016   left upper chest  . 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 No pertinent family history.  Social History   Socioeconomic History  . Marital status: Married    Spouse name: Not on file  . Number of children: Not on file  . Years of education: Not on file  . Highest education level: Not on file  Occupational History  . Not on file  Social Needs  . Financial resource strain: Not on file  . Food insecurity:    Worry: Not on file    Inability: Not on file  . Transportation needs:    Medical: Not on file    Non-medical: Not on file  Tobacco Use  . Smoking status: Never Smoker  . Smokeless tobacco: Never Used  Substance and Sexual Activity  . Alcohol use: Not on file    Comment: 4 drinks a week  . Drug use: Never  . Sexual activity: Not on file  Lifestyle  . Physical activity:    Days per week: Not on file    Minutes per session: Not on file  . Stress: Not on file  Relationships  . Social connections:    Talks on phone: Not on file    Gets together: Not on file    Attends religious service: Not on file    Active member of club or organization: Not on file    Attends meetings of clubs or organizations: Not on file    Relationship status: Not on file  . Intimate partner violence:    Fear of current or ex partner: Not on file    Emotionally abused: Not on file    Physically abused: Not on file    Forced sexual activity: Not on file  Other Topics Concern  . Not on file  Social History Narrative  . Not on file    Past Medical History, Surgical history, Social history, and Family history were reviewed and updated as appropriate.   Please see review of systems for further details on the patient's review from today.   Objective:   Physical Exam:  There were no vitals taken for this visit.  Physical Exam Neurological:     Mental Status: She is alert and oriented to person, place, and time.     Cranial Nerves: No dysarthria.  Psychiatric:        Attention and Perception: Attention  normal.        Mood and Affect: Mood is anxious.        Speech: Speech normal.        Behavior: Behavior is cooperative.        Thought Content: Thought content normal. Thought content is not paranoid or delusional. Thought content does not include homicidal or suicidal ideation. Thought content does not include homicidal or suicidal plan.        Cognition and Memory: Cognition and memory normal.        Judgment: Judgment normal.     Lab Review:  Component Value Date/Time   NA 139 10/10/2017 1236   K 4.1 10/10/2017 1236   CL 104 10/10/2017 1236   CO2 25 10/10/2017 1236   GLUCOSE 105 (H) 10/10/2017 1236   BUN 21 (H) 10/10/2017 1236   CREATININE 0.89 10/10/2017 1236   CALCIUM 9.5 10/10/2017 1236   PROT 7.1 10/10/2017 1236   ALBUMIN 4.4 10/10/2017 1236   AST 20 10/10/2017 1236   ALT 19 10/10/2017 1236   ALKPHOS 53 10/10/2017 1236   BILITOT 0.9 10/10/2017 1236   GFRNONAA >60 10/10/2017 1236   GFRAA >60 10/10/2017 1236       Component Value Date/Time   WBC 8.4 10/10/2017 1236   RBC 4.68 10/10/2017 1236   HGB 14.0 10/10/2017 1236   HCT 40.6 10/10/2017 1236   PLT 221 10/10/2017 1236   MCV 86.8 10/10/2017 1236   MCH 29.9 10/10/2017 1236   MCHC 34.5 10/10/2017 1236   RDW 12.4 10/10/2017 1236    No results found for: POCLITH, LITHIUM   No results found for: PHENYTOIN, PHENOBARB, VALPROATE, CBMZ   .res Assessment: Plan:   Will continue Effexor XR 75 mg daily for anxiety and depression.  Discussed that if she forgets to take a dose she could take 1 of the remaining 37.5 mg capsules and then resume 75 mg daily the following day to prevent discontinuation signs and symptoms. Will continue Lorazepam 0.5 mg po qd prn anxiety. Pt to f/u in 6 months or sooner if clinically indicate. Patient advised to contact office with any questions, adverse effects, or acute worsening in signs and symptoms.  Generalized anxiety disorder - Plan: venlafaxine XR (EFFEXOR-XR) 75 MG 24 hr  capsule  Mild episode of recurrent major depressive disorder ([place]) - Plan: venlafaxine XR (EFFEXOR-XR) 75 MG 24 hr capsule  Please see After Visit Summary for patient specific instructions.  No future appointments.  No orders of the defined types were placed in this encounter.     -------------------------------

## 2019-02-11 DIAGNOSIS — D0439 Carcinoma in situ of skin of other parts of face: Secondary | ICD-10-CM | POA: Diagnosis not present

## 2019-02-11 DIAGNOSIS — L570 Actinic keratosis: Secondary | ICD-10-CM | POA: Diagnosis not present

## 2019-03-11 DIAGNOSIS — D04112 Carcinoma in situ of skin of right lower eyelid, including canthus: Secondary | ICD-10-CM | POA: Diagnosis not present

## 2019-04-14 DIAGNOSIS — M7912 Myalgia of auxiliary muscles, head and neck: Secondary | ICD-10-CM | POA: Diagnosis not present

## 2019-04-14 DIAGNOSIS — M9902 Segmental and somatic dysfunction of thoracic region: Secondary | ICD-10-CM | POA: Diagnosis not present

## 2019-04-14 DIAGNOSIS — M542 Cervicalgia: Secondary | ICD-10-CM | POA: Diagnosis not present

## 2019-04-14 DIAGNOSIS — M9901 Segmental and somatic dysfunction of cervical region: Secondary | ICD-10-CM | POA: Diagnosis not present

## 2019-08-16 ENCOUNTER — Other Ambulatory Visit: Payer: Self-pay | Admitting: Psychiatry

## 2019-08-16 DIAGNOSIS — F330 Major depressive disorder, recurrent, mild: Secondary | ICD-10-CM

## 2019-08-16 DIAGNOSIS — F411 Generalized anxiety disorder: Secondary | ICD-10-CM

## 2019-08-17 NOTE — Telephone Encounter (Signed)
Last visit 08/2018

## 2020-02-12 ENCOUNTER — Encounter: Payer: Self-pay | Admitting: Gastroenterology

## 2020-03-11 ENCOUNTER — Encounter: Payer: Self-pay | Admitting: Gastroenterology

## 2020-03-11 ENCOUNTER — Other Ambulatory Visit: Payer: Self-pay

## 2020-03-11 ENCOUNTER — Ambulatory Visit (AMBULATORY_SURGERY_CENTER): Payer: Self-pay

## 2020-03-11 VITALS — Ht 64.0 in | Wt 145.8 lb

## 2020-03-11 DIAGNOSIS — Z1211 Encounter for screening for malignant neoplasm of colon: Secondary | ICD-10-CM

## 2020-03-11 MED ORDER — NA SULFATE-K SULFATE-MG SULF 17.5-3.13-1.6 GM/177ML PO SOLN: 1.0000 | Freq: Once | ORAL | 0 refills | Status: AC

## 2020-03-11 NOTE — Progress Notes (Signed)
No allergies to soy or egg Pt is not on blood thinners or diet pills Denies issues with sedation/intubation Denies atrial flutter/fib Denies constipation   Emmi instructions given to pt  Pt is aware of Covid safety and care partner requirements.  

## 2020-03-15 ENCOUNTER — Other Ambulatory Visit: Payer: Self-pay | Admitting: Psychiatry

## 2020-03-15 DIAGNOSIS — F411 Generalized anxiety disorder: Secondary | ICD-10-CM

## 2020-03-15 DIAGNOSIS — F330 Major depressive disorder, recurrent, mild: Secondary | ICD-10-CM

## 2020-03-17 NOTE — Telephone Encounter (Signed)
Last apt 08/2018

## 2020-03-26 ENCOUNTER — Other Ambulatory Visit: Payer: Self-pay

## 2020-03-26 ENCOUNTER — Encounter: Payer: Self-pay | Admitting: Gastroenterology

## 2020-03-26 ENCOUNTER — Ambulatory Visit (AMBULATORY_SURGERY_CENTER): Payer: BC Managed Care – PPO | Admitting: Gastroenterology

## 2020-03-26 VITALS — BP 117/79 | HR 60 | Temp 97.8°F | Resp 18 | Ht 64.0 in | Wt 145.0 lb

## 2020-03-26 DIAGNOSIS — Z1211 Encounter for screening for malignant neoplasm of colon: Secondary | ICD-10-CM | POA: Diagnosis present

## 2020-03-26 MED ORDER — SODIUM CHLORIDE 0.9 % IV SOLN: 500.0000 mL | Freq: Once | INTRAVENOUS | Status: DC

## 2020-03-26 NOTE — Progress Notes (Signed)
Pt's states no medical or surgical changes since previsit or office visit. VS by CW. 

## 2020-03-26 NOTE — Progress Notes (Signed)
To pacu, VSS. Report to rn.tb °

## 2020-03-26 NOTE — Op Note (Signed)
[facility] Patient Name: Sheri Holland Procedure Date: 03/26/2020 8:37 AM MRN: [id number] Endoscopist: Mauri Pole , MD Age: 59 Referring MD:  Date of Birth: 1960/07/28 Gender: Female Account #: [id number] Procedure:                Colonoscopy Indications:              Screening for colorectal malignant neoplasm Medicines:                Monitored Anesthesia Care Procedure:                Pre-Anesthesia Assessment:                           - Prior to the procedure, a History and Physical                            was performed, and patient medications and                            allergies were reviewed. The patient's tolerance of                            previous anesthesia was also reviewed. The risks                            and benefits of the procedure and the sedation                            options and risks were discussed with the patient.                            All questions were answered, and informed consent                            was obtained. Prior Anticoagulants: The patient has                            taken no previous anticoagulant or antiplatelet                            agents. ASA Grade Assessment: II - A patient with                            mild systemic disease. After reviewing the risks                            and benefits, the patient was deemed in                            satisfactory condition to undergo the procedure.                           After obtaining informed consent, the colonoscope  was passed under direct vision. Throughout the                            procedure, the patient's blood pressure, pulse, and                            oxygen saturations were monitored continuously. The                            Colonoscope was introduced through the anus and                            advanced to the the cecum, identified by                            appendiceal orifice  and ileocecal valve. The                            colonoscopy was performed without difficulty. The                            patient tolerated the procedure well. The quality                            of the bowel preparation was excellent. The                            ileocecal valve, appendiceal orifice, and rectum                            were photographed. Scope In: 8:48:57 AM Scope Out: 8:59:34 AM Scope Withdrawal Time: 0 hours 6 minutes 44 seconds  Total Procedure Duration: 0 hours 10 minutes 37 seconds  Findings:                 The perianal and digital rectal examinations were                            normal.                           Non-bleeding internal hemorrhoids were found during                            retroflexion. The hemorrhoids were small.                           The exam was otherwise without abnormality. Complications:            No immediate complications. Estimated Blood Loss:     Estimated blood loss: none. Impression:               - Non-bleeding internal hemorrhoids.                           - The examination was otherwise normal.                           -  No specimens collected. Recommendation:           - Patient has a contact number available for                            emergencies. The signs and symptoms of potential                            delayed complications were discussed with the                            patient. Return to normal activities tomorrow.                            Written discharge instructions were provided to the                            patient.                           - Resume previous diet.                           - Continue present medications.                           - Repeat colonoscopy in 10 years for screening                            purposes. Mauri Pole, MD 03/26/2020 9:02:54 AM This report has been signed electronically.

## 2020-03-26 NOTE — Patient Instructions (Signed)
Discharge instructions given. Handout out on Hemorrhoids. Resume previous medications. YOU HAD AN ENDOSCOPIC PROCEDURE TODAY AT [facility]:   Refer to the procedure report that was given to you for any specific questions about what was found during the examination.  If the procedure report does not answer your questions, please call your gastroenterologist to clarify.  If you requested that your care partner not be given the details of your procedure findings, then the procedure report has been included in a sealed envelope for you to review at your convenience later.  YOU SHOULD EXPECT: Some feelings of bloating in the abdomen. Passage of more gas than usual.  Walking can help get rid of the air that was put into your GI tract during the procedure and reduce the bloating. If you had a lower endoscopy (such as a colonoscopy or flexible sigmoidoscopy) you may notice spotting of blood in your stool or on the toilet paper. If you underwent a bowel prep for your procedure, you may not have a normal bowel movement for a few days.  Please Note:  You might notice some irritation and congestion in your nose or some drainage.  This is from the oxygen used during your procedure.  There is no need for concern and it should clear up in a day or so.  SYMPTOMS TO REPORT IMMEDIATELY:   Following lower endoscopy (colonoscopy or flexible sigmoidoscopy):  Excessive amounts of blood in the stool  Significant tenderness or worsening of abdominal pains  Swelling of the abdomen that is new, acute  Fever of 100F or higher   For urgent or emergent issues, a gastroenterologist can be reached at any hour by calling [phone]. Do not use MyChart messaging for urgent concerns.    DIET:  We do recommend a small meal at first, but then you may proceed to your regular diet.  Drink plenty of fluids but you should avoid alcoholic beverages for 24 hours.  ACTIVITY:  You should plan to take it easy for  the rest of today and you should NOT DRIVE or use heavy machinery until tomorrow (because of the sedation medicines used during the test).    FOLLOW UP: Our staff will call the number listed on your records 48-72 hours following your procedure to check on you and address any questions or concerns that you may have regarding the information given to you following your procedure. If we do not reach you, we will leave a message.  We will attempt to reach you two times.  During this call, we will ask if you have developed any symptoms of COVID 19. If you develop any symptoms (ie: fever, flu-like symptoms, shortness of breath, cough etc.) before then, please call [phone].  If you test positive for Covid 19 in the 2 weeks post procedure, please call and report this information to [place].    If any biopsies were taken you will be contacted by phone or by letter within the next 1-3 weeks.  Please call us at [phone] if you have not heard about the biopsies in 3 weeks.    SIGNATURES/CONFIDENTIALITY: You and/or your care partner have signed paperwork which will be entered into your electronic medical record.  These signatures attest to the fact that that the information above on your After Visit Summary has been reviewed and is understood.  Full responsibility of the confidentiality of this discharge information lies with you and/or your care-partner.

## 2020-03-30 ENCOUNTER — Telehealth: Payer: Self-pay

## 2020-03-30 NOTE — Telephone Encounter (Signed)
  Follow up Call-  Call back number 03/26/2020  Post procedure Call Back phone  # [phone]  Permission to leave phone message Yes  Some recent data might be hidden     Patient questions:  Do you have a fever, pain , or abdominal swelling? No. Pain Score  0 *  Have you tolerated food without any problems? Yes.    Have you been able to return to your normal activities? Yes.    Do you have any questions about your discharge instructions: Diet   No. Medications  No. Follow up visit  No.  Do you have questions or concerns about your Care? No.  Actions: * If pain score is 4 or above: No action needed, pain <4.  1. Have you developed a fever since your procedure? no  2.   Have you had an respiratory symptoms (SOB or cough) since your procedure? no  3.   Have you tested positive for COVID 19 since your procedure no  4.   Have you had any family members/close contacts diagnosed with the COVID 19 since your procedure?  No    If yes to any of these questions please route to Joylene John, RN and Joella Prince, RN

## 2020-03-30 NOTE — Telephone Encounter (Signed)
  Follow up Call-  Call back number 03/26/2020  Post procedure Call Back phone  # [phone]  Permission to leave phone message Yes  Some recent data might be hidden     1st follow up call made.  NALM

## 2020-05-03 ENCOUNTER — Telehealth: Payer: Self-pay

## 2020-05-03 NOTE — Telephone Encounter (Signed)
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 I called to confirm that the right inner canthus was treated 02/2019. The skin surgery center confirmed the treatment was done and a skin check was preformed by there office this year and at that time a bx was done for a isk on the mid upper anterior thorax.So patient is to follow up with the skin surgery center

## 2020-10-05 ENCOUNTER — Emergency Department (HOSPITAL_COMMUNITY): Payer: 59 | Admitting: Anesthesiology

## 2020-10-05 ENCOUNTER — Other Ambulatory Visit: Payer: Self-pay

## 2020-10-05 ENCOUNTER — Encounter (HOSPITAL_COMMUNITY)
Admission: EM | Disposition: A | Discharge status: Home / Self Care | Payer: Self-pay | Source: Home / Self Care | Attending: Emergency Medicine

## 2020-10-05 ENCOUNTER — Observation Stay (HOSPITAL_COMMUNITY)
Admission: EM | Admit: 2020-10-05 | Discharge: 2020-10-07 | Disposition: A | Discharge status: Home / Self Care | Payer: 59 | Attending: Surgery | Admitting: Surgery

## 2020-10-05 ENCOUNTER — Encounter (HOSPITAL_COMMUNITY): Payer: Self-pay

## 2020-10-05 ENCOUNTER — Emergency Department (HOSPITAL_COMMUNITY): Payer: 59

## 2020-10-05 DIAGNOSIS — Z85828 Personal history of other malignant neoplasm of skin: Secondary | ICD-10-CM | POA: Insufficient documentation

## 2020-10-05 DIAGNOSIS — Z20822 Contact with and (suspected) exposure to covid-19: Secondary | ICD-10-CM | POA: Insufficient documentation

## 2020-10-05 DIAGNOSIS — Z79899 Other long term (current) drug therapy: Secondary | ICD-10-CM | POA: Insufficient documentation

## 2020-10-05 DIAGNOSIS — R1031 Right lower quadrant pain: Secondary | ICD-10-CM | POA: Diagnosis present

## 2020-10-05 DIAGNOSIS — K3580 Unspecified acute appendicitis: Secondary | ICD-10-CM | POA: Diagnosis present

## 2020-10-05 DIAGNOSIS — K35891 Other acute appendicitis without perforation, with gangrene: Principal | ICD-10-CM | POA: Insufficient documentation

## 2020-10-05 HISTORY — PX: LAPAROSCOPIC APPENDECTOMY: SHX408

## 2020-10-05 LAB — CBC
HCT: 41.2 % (ref 36.0–46.0)
Hemoglobin: 13.7 g/dL (ref 12.0–15.0)
MCH: 29.5 pg (ref 26.0–34.0)
MCHC: 33.3 g/dL (ref 30.0–36.0)
MCV: 88.6 fL (ref 80.0–100.0)
Platelets: 228 10*3/uL (ref 150–400)
RBC: 4.65 MIL/uL (ref 3.87–5.11)
RDW: 12.5 % (ref 11.5–15.5)
WBC: 20.1 10*3/uL — ABNORMAL HIGH (ref 4.0–10.5)
nRBC: 0 % (ref 0.0–0.2)

## 2020-10-05 LAB — URINALYSIS, ROUTINE W REFLEX MICROSCOPIC
Bacteria, UA: NONE SEEN
Bilirubin Urine: NEGATIVE
Glucose, UA: NEGATIVE mg/dL
Ketones, ur: 5 mg/dL — AB
Leukocytes,Ua: NEGATIVE
Nitrite: NEGATIVE
Protein, ur: 30 mg/dL — AB
Specific Gravity, Urine: 1.024 (ref 1.005–1.030)
pH: 5 (ref 5.0–8.0)

## 2020-10-05 LAB — COMPREHENSIVE METABOLIC PANEL
ALT: 18 U/L (ref 0–44)
AST: 16 U/L (ref 15–41)
Albumin: 3.7 g/dL (ref 3.5–5.0)
Alkaline Phosphatase: 56 U/L (ref 38–126)
Anion gap: 10 (ref 5–15)
BUN: 10 mg/dL (ref 6–20)
CO2: 25 mmol/L (ref 22–32)
Calcium: 9.4 mg/dL (ref 8.9–10.3)
Chloride: 101 mmol/L (ref 98–111)
Creatinine, Ser: 0.78 mg/dL (ref 0.44–1.00)
GFR, Estimated: >60 mL/min (ref >60)
Glucose, Bld: 143 mg/dL — ABNORMAL HIGH (ref 70–99)
Potassium: 3.6 mmol/L (ref 3.5–5.1)
Sodium: 136 mmol/L (ref 135–145)
Total Bilirubin: 0.8 mg/dL (ref 0.3–1.2)
Total Protein: 6.7 g/dL (ref 6.5–8.1)

## 2020-10-05 LAB — RESP PANEL BY RT-PCR (FLU A&B, COVID) ARPGX2
Influenza A by PCR: NEGATIVE
Influenza B by PCR: NEGATIVE
SARS Coronavirus 2 by RT PCR: NEGATIVE

## 2020-10-05 LAB — LIPASE, BLOOD: Lipase: 23 U/L (ref 11–51)

## 2020-10-05 SURGERY — APPENDECTOMY, LAPAROSCOPIC
Anesthesia: General

## 2020-10-05 MED ORDER — FENTANYL CITRATE (PF) 250 MCG/5ML IJ SOLN
INTRAMUSCULAR | Status: AC
  Filled 2020-10-05: qty 5

## 2020-10-05 MED ORDER — DEXAMETHASONE SODIUM PHOSPHATE 10 MG/ML IJ SOLN
INTRAMUSCULAR | Status: AC
  Filled 2020-10-05: qty 1

## 2020-10-05 MED ORDER — FENTANYL CITRATE (PF) 100 MCG/2ML IJ SOLN
50.0000 ug | Freq: Once | INTRAMUSCULAR | Status: AC
  Administered 2020-10-05: 50 ug via INTRAVENOUS
  Filled 2020-10-05: qty 2

## 2020-10-05 MED ORDER — VENLAFAXINE HCL ER 75 MG PO CP24
75.0000 mg | ORAL_CAPSULE | Freq: Every day | ORAL | Status: DC
  Administered 2020-10-06 – 2020-10-07 (×2): 75 mg via ORAL
  Filled 2020-10-05 (×2): qty 1

## 2020-10-05 MED ORDER — SUCCINYLCHOLINE CHLORIDE 20 MG/ML IJ SOLN
INTRAMUSCULAR | Status: DC | PRN
  Administered 2020-10-05: 100 mg via INTRAVENOUS

## 2020-10-05 MED ORDER — ONDANSETRON HCL 4 MG/2ML IJ SOLN
INTRAMUSCULAR | Status: AC
  Filled 2020-10-05: qty 2

## 2020-10-05 MED ORDER — KETOROLAC TROMETHAMINE 30 MG/ML IJ SOLN
INTRAMUSCULAR | Status: DC | PRN
  Administered 2020-10-05: 30 mg via INTRAVENOUS

## 2020-10-05 MED ORDER — OXYCODONE HCL 5 MG PO TABS: 5.0000 mg | ORAL_TABLET | ORAL | Status: DC | PRN

## 2020-10-05 MED ORDER — SUCCINYLCHOLINE CHLORIDE 200 MG/10ML IV SOSY
PREFILLED_SYRINGE | INTRAVENOUS | Status: AC
  Filled 2020-10-05: qty 10

## 2020-10-05 MED ORDER — BUPIVACAINE HCL (PF) 0.25 % IJ SOLN
INTRAMUSCULAR | Status: DC | PRN
  Administered 2020-10-05: 30 mL

## 2020-10-05 MED ORDER — PHENYLEPHRINE HCL (PRESSORS) 10 MG/ML IV SOLN
INTRAVENOUS | Status: DC | PRN
  Administered 2020-10-05: 80 ug via INTRAVENOUS

## 2020-10-05 MED ORDER — SODIUM CHLORIDE 0.9 % IR SOLN
Status: DC | PRN
  Administered 2020-10-05: 1000 mL

## 2020-10-05 MED ORDER — SUGAMMADEX SODIUM 200 MG/2ML IV SOLN
INTRAVENOUS | Status: DC | PRN
  Administered 2020-10-05: 300 mg via INTRAVENOUS

## 2020-10-05 MED ORDER — ENOXAPARIN SODIUM 40 MG/0.4ML IJ SOSY
40.0000 mg | PREFILLED_SYRINGE | INTRAMUSCULAR | Status: DC
  Administered 2020-10-05 – 2020-10-06 (×2): 40 mg via SUBCUTANEOUS
  Filled 2020-10-05 (×2): qty 0.4

## 2020-10-05 MED ORDER — SODIUM CHLORIDE 0.9 % IV SOLN
2.0000 g | Freq: Once | INTRAVENOUS | Status: AC
  Administered 2020-10-05: 2 g via INTRAVENOUS
  Filled 2020-10-05: qty 2

## 2020-10-05 MED ORDER — LABETALOL HCL 5 MG/ML IV SOLN
INTRAVENOUS | Status: AC
  Filled 2020-10-05: qty 4

## 2020-10-05 MED ORDER — LIDOCAINE 2% (20 MG/ML) 5 ML SYRINGE
INTRAMUSCULAR | Status: AC
  Filled 2020-10-05: qty 5

## 2020-10-05 MED ORDER — ONDANSETRON HCL 4 MG/2ML IJ SOLN
INTRAMUSCULAR | Status: DC | PRN
  Administered 2020-10-05: 4 mg via INTRAVENOUS

## 2020-10-05 MED ORDER — LIDOCAINE 2% (20 MG/ML) 5 ML SYRINGE
INTRAMUSCULAR | Status: DC | PRN
  Administered 2020-10-05: 40 mg via INTRAVENOUS

## 2020-10-05 MED ORDER — DEXAMETHASONE SODIUM PHOSPHATE 10 MG/ML IJ SOLN
INTRAMUSCULAR | Status: DC | PRN
  Administered 2020-10-05: 4 mg via INTRAVENOUS

## 2020-10-05 MED ORDER — METRONIDAZOLE 500 MG/100ML IV SOLN
500.0000 mg | Freq: Once | INTRAVENOUS | Status: AC
  Administered 2020-10-05: 500 mg via INTRAVENOUS
  Filled 2020-10-05: qty 100

## 2020-10-05 MED ORDER — FENTANYL CITRATE (PF) 100 MCG/2ML IJ SOLN
INTRAMUSCULAR | Status: DC | PRN
  Administered 2020-10-05: 100 ug via INTRAVENOUS

## 2020-10-05 MED ORDER — HYDROMORPHONE HCL 1 MG/ML IJ SOLN: 0.5000 mg | INTRAMUSCULAR | Status: DC | PRN

## 2020-10-05 MED ORDER — FENTANYL CITRATE (PF) 100 MCG/2ML IJ SOLN: 25.0000 ug | INTRAMUSCULAR | Status: DC | PRN

## 2020-10-05 MED ORDER — SODIUM CHLORIDE 0.9 % IV BOLUS
500.0000 mL | Freq: Once | INTRAVENOUS | Status: AC
  Administered 2020-10-05: 500 mL via INTRAVENOUS

## 2020-10-05 MED ORDER — ACETAMINOPHEN 325 MG PO TABS
650.0000 mg | ORAL_TABLET | Freq: Four times a day (QID) | ORAL | Status: DC
  Administered 2020-10-05 – 2020-10-07 (×7): 650 mg via ORAL
  Filled 2020-10-05 (×7): qty 2

## 2020-10-05 MED ORDER — OXYCODONE HCL 5 MG PO TABS: 10.0000 mg | ORAL_TABLET | ORAL | Status: DC | PRN

## 2020-10-05 MED ORDER — IBUPROFEN 800 MG PO TABS: 800.0000 mg | ORAL_TABLET | Freq: Three times a day (TID) | ORAL | 0 refills | Status: AC

## 2020-10-05 MED ORDER — KETOROLAC TROMETHAMINE 15 MG/ML IJ SOLN
15.0000 mg | Freq: Three times a day (TID) | INTRAMUSCULAR | Status: DC
  Administered 2020-10-05 – 2020-10-07 (×5): 15 mg via INTRAVENOUS
  Filled 2020-10-05 (×5): qty 1

## 2020-10-05 MED ORDER — PROPOFOL 10 MG/ML IV BOLUS
INTRAVENOUS | Status: DC | PRN
  Administered 2020-10-05: 160 mg via INTRAVENOUS

## 2020-10-05 MED ORDER — ONDANSETRON HCL 4 MG/2ML IJ SOLN
4.0000 mg | Freq: Once | INTRAMUSCULAR | Status: AC
  Administered 2020-10-05: 4 mg via INTRAVENOUS
  Filled 2020-10-05: qty 2

## 2020-10-05 MED ORDER — PHENYLEPHRINE 40 MCG/ML (10ML) SYRINGE FOR IV PUSH (FOR BLOOD PRESSURE SUPPORT)
PREFILLED_SYRINGE | INTRAVENOUS | Status: AC
  Filled 2020-10-05: qty 10

## 2020-10-05 MED ORDER — KETOROLAC TROMETHAMINE 30 MG/ML IJ SOLN
INTRAMUSCULAR | Status: AC
  Filled 2020-10-05: qty 1

## 2020-10-05 MED ORDER — MIDAZOLAM HCL 2 MG/2ML IJ SOLN
INTRAMUSCULAR | Status: AC
  Filled 2020-10-05: qty 2

## 2020-10-05 MED ORDER — SODIUM CHLORIDE 0.9 % IV SOLN: INTRAVENOUS | Status: DC

## 2020-10-05 MED ORDER — LACTATED RINGERS IV SOLN: INTRAVENOUS | Status: DC | PRN

## 2020-10-05 MED ORDER — ROCURONIUM BROMIDE 10 MG/ML (PF) SYRINGE
PREFILLED_SYRINGE | INTRAVENOUS | Status: AC
  Filled 2020-10-05: qty 10

## 2020-10-05 MED ORDER — OXYCODONE-ACETAMINOPHEN 5-325 MG PO TABS: 1.0000 | ORAL_TABLET | ORAL | 0 refills | Status: AC | PRN

## 2020-10-05 MED ORDER — MORPHINE SULFATE (PF) 4 MG/ML IV SOLN
4.0000 mg | Freq: Once | INTRAVENOUS | Status: AC
  Administered 2020-10-05: 4 mg via INTRAVENOUS
  Filled 2020-10-05: qty 1

## 2020-10-05 MED ORDER — ROCURONIUM BROMIDE 10 MG/ML (PF) SYRINGE
PREFILLED_SYRINGE | INTRAVENOUS | Status: DC | PRN
  Administered 2020-10-05: 40 mg via INTRAVENOUS

## 2020-10-05 MED ORDER — MIDAZOLAM HCL 5 MG/5ML IJ SOLN
INTRAMUSCULAR | Status: DC | PRN
  Administered 2020-10-05: 1 mg via INTRAVENOUS

## 2020-10-05 SURGICAL SUPPLY — 25 items
CATH FOLEY LATEX FREE 16FR (CATHETERS) ×1
CATH FOLEY LF 16FR (CATHETERS) ×1 IMPLANT
CHLORAPREP W/TINT 26 (MISCELLANEOUS) ×2 IMPLANT
CNTNR URN SCR LID CUP LEK RST (MISCELLANEOUS) ×1 IMPLANT
CONT SPEC 4OZ STRL OR WHT (MISCELLANEOUS) ×1
CUTTER FLEX LINEAR 45M (STAPLE) ×2 IMPLANT
DERMABOND ADHESIVE PROPEN (GAUZE/BANDAGES/DRESSINGS) ×2
DERMABOND ADVANCED .7 DNX6 (GAUZE/BANDAGES/DRESSINGS) ×2 IMPLANT
DEVICE TROCAR PUNCTURE CLOSURE (ENDOMECHANICALS) ×2 IMPLANT
DRAPE LAPAROSCOPIC ABDOMINAL (DRAPES) ×2 IMPLANT
GLOVE SURG DERMASSURE 7.5 (GLOVE) ×4 IMPLANT
KIT BASIN OR (CUSTOM PROCEDURE TRAY) ×2 IMPLANT
NDL INSUFF ACCESS 14 VERSASTEP (NEEDLE) ×2 IMPLANT
SET IRRIG TUBING LAPAROSCOPIC (IRRIGATION / IRRIGATOR) ×2 IMPLANT
SHEARS HARMONIC HDI 36CM (ELECTROSURGICAL) ×2 IMPLANT
SLEEVE SCD COMPRESS KNEE MED (STOCKING) ×2 IMPLANT
SOL ANTI FOG 6CC (MISCELLANEOUS) ×2 IMPLANT
SOLUTION ANTI FOG 6CC (MISCELLANEOUS) ×2
STAPLER 45 BLU RELOAD XI (STAPLE) ×1 IMPLANT
STAPLER 45 BLUE RELOAD XI (STAPLE) ×2
STAPLER 45 WHITE RELOAD XI (STAPLE) ×1
STAPLER 45 WHT RELOAD XI (STAPLE) ×1 IMPLANT
SUT MON AB 4-0 PC3 18 (SUTURE) ×2 IMPLANT
TROCAR OPTI TIP 5M 100M (ENDOMECHANICALS) ×4 IMPLANT
TROCAR XCEL 12X100 BLDLESS (ENDOMECHANICALS) ×2 IMPLANT

## 2020-10-05 NOTE — ED Notes (Signed)
Patient transported to CT 

## 2020-10-05 NOTE — Transfer of Care (Signed)
Immediate Anesthesia Transfer of Care Note  Patient: Sheri Holland  Procedure(s) Performed: APPENDECTOMY LAPAROSCOPIC (N/A )  Patient Location: PACU  Anesthesia Type:General  Level of Consciousness: drowsy, patient cooperative and responds to stimulation  Airway & Oxygen Therapy: Patient Spontanous Breathing  Post-op Assessment: Report given to RN and Post -op Vital signs reviewed and stable  Post vital signs: Reviewed and stable  Last Vitals:  Vitals Value Taken Time  BP 125/69 10/05/20 2114  Temp 36.4 C 10/05/20 2112  Pulse 95 10/05/20 2116  Resp 32 10/05/20 2116  SpO2 92 % 10/05/20 2116  Vitals shown include unvalidated device data.  Last Pain:  Vitals:   10/05/20 2112  TempSrc:   PainSc: 0-No pain         Complications: No complications documented.

## 2020-10-05 NOTE — ED Triage Notes (Signed)
Pt reports having bad epigastric pain friday night. Seen here dr at work yesterday and everything was normal. Pt continues to have bad abd pain N/V. Pt denies CP today.

## 2020-10-05 NOTE — Anesthesia Procedure Notes (Signed)
Procedure Name: Intubation Date/Time: 10/05/2020 8:14 PM Performed by: Eligha Bridegroom, CRNA Pre-anesthesia Checklist: Patient identified, Emergency Drugs available, Suction available and Patient being monitored Patient Re-evaluated:Patient Re-evaluated prior to induction Oxygen Delivery Method: Circle System Utilized Preoxygenation: Pre-oxygenation with 100% oxygen Induction Type: IV induction Ventilation: Mask ventilation without difficulty Laryngoscope Size: Mac and 4 Grade View: Grade I Tube type: Oral Number of attempts: 1 Airway Equipment and Method: Stylet Placement Confirmation: ETT inserted through vocal cords under direct vision,  positive ETCO2 and breath sounds checked- equal and bilateral Secured at: 22 cm Tube secured with: Tape Dental Injury: Teeth and Oropharynx as per pre-operative assessment

## 2020-10-05 NOTE — Anesthesia Preprocedure Evaluation (Signed)
Anesthesia Evaluation  Patient identified by MRN, date of birth, ID band Patient awake    Reviewed: Allergy & Precautions, NPO status , Patient's Chart, lab work & pertinent test results  Airway Mallampati: II  TM Distance: >3 FB     Dental   Pulmonary neg pulmonary ROS,    breath sounds clear to auscultation       Cardiovascular negative cardio ROS   Rhythm:Regular Rate:Normal     Neuro/Psych Anxiety Depression negative neurological ROS     GI/Hepatic Neg liver ROS, History noted Dr. Nyoka Cowden   Endo/Other  negative endocrine ROS  Renal/GU negative Renal ROS     Musculoskeletal   Abdominal   Peds  Hematology   Anesthesia Other Findings   Reproductive/Obstetrics                             Anesthesia Physical Anesthesia Plan  ASA: II  Anesthesia Plan: General   Post-op Pain Management:    Induction: Intravenous  PONV Risk Score and Plan: 3 and Ondansetron, Dexamethasone and Midazolam  Airway Management Planned: Oral ETT  Additional Equipment:   Intra-op Plan:   Post-operative Plan: Extubation in OR  Informed Consent: I have reviewed the patients History and Physical, chart, labs and discussed the procedure including the risks, benefits and alternatives for the proposed anesthesia with the patient or authorized representative who has indicated his/her understanding and acceptance.     Dental advisory given  Plan Discussed with: CRNA and Anesthesiologist  Anesthesia Plan Comments:         Anesthesia Quick Evaluation

## 2020-10-05 NOTE — Op Note (Signed)
Patient: Sheri Holland ([birth year], [id number])  Date of Surgery: 10/05/2020   Preoperative Diagnosis: Acute appendicitis  Postoperative Diagnosis: Acute gangrenous appendicitis  Surgical Procedure: APPENDECTOMY LAPAROSCOPIC:    Operative Team Members:  Surgeon(s) and Role:    * Maui Ahart, Nickola Major, MD - Primary   Anesthesiologist: Belinda Block, MD CRNA: Eligha Bridegroom, CRNA; Oletta Lamas, CRNA   Anesthesia: General   Fluids:  Total I/O In: 1100 [I.V.:1000; IV Piggyback:100] Out: 10 [MEQAS:34]  Complications: none  Drains:  none   Specimen:  ID Type Source Tests Collected by Time Destination  1 : appendix Tissue PATH Appendix SURGICAL PATHOLOGY Mariem Skolnick, Nickola Major, MD 10/05/2020 2041      Disposition:  PACU - hemodynamically stable.  Plan of Care: Admit for overnight observation    Indications for Procedure: Sheri Holland is a 60 y.o. female who presented with abdominal pain.  History, physical and imaging was concerning for appendicitis, so laparoscopic appendectomy was recommended for the patient.  The procedure itself, as well as the risks, benefits and alternatives were discussed with the patient.  Risks discussed included but were not limited to the risk of bleeding, infection, damage to nearby structures, need to convert to open procedure, incisional hernia, and the need for additional procedures or surgeries.  With this discussion complete and all questions answered the patient granted consent to proceed.  Findings: Gangrenous appendicitis  Description of Procedure:   On the date stated above, the patient was taken to the operating room suite and placed in supine positioning with the left arm tucked.  Sequential compression devices were placed on the lower extremities to prevent blood clots.  General endotracheal anesthesia was induced.  A foley catheter was placed to decompress the bladder, it was removed at the end of the case.   Preoperative  antibiotics were given within 30 minutes of incision.  The patient's abdomen was prepped and draped in the usual sterile fashion.  A time-out was completed verifying the correct patient, procedure, positioning and equipment needed for the case.  We began by anesthetizing the skin with local anesthetic and then making a 5 mm incision just below the umbilicus.  We dissected through the subcutaneous tissues to the fascia.  The fascia was grasped and elevated using a Kocher clamp.  A Veress needle was inserted into the abdomen and the abdomen was insufflated to 15 mmHg.  A 5 mm trocar was inserted in this position under optical guidance and then the abdomen was inspected.  There was no trauma to the underlying viscera with initial trocar placement.  Any abnormal findings, other than inflammation in the right lower quadrant, are listed above in the findings section.  Two additional trocars were placed, one 5 mm trocar suprapubically and one 12 mm trocar in the left lower quadrant.  These were placed under direct vision without any trauma to the underlying viscera.    The patient was then placed in head down, left side down positioning.  The appendix was identified and dissected free from its attachments to the abdominal wall, small intestine and cecum.  A window was created in the mesoappendix using blunt dissection.  We used one 47mm white load of the endoscopic linear stapler to divide the base of the appendix from the cecum.  The mesoappendix was divided using the harmonic scalpel.  The appendix was placed in an endocatch bag and removed through the 12 mm port site in the left lower quadrant.  A suction irrigator was used  to clean the operative field. The staple line was well formed.  There was good hemostasis at the end of the case.  At this point we directed our attention to closure.  The patient was moved back to a level position.  The 12 mm trocar site was closed at the fascial level using an 0-vicryl on a  fascial suture passer.  The abdomen was desufflated.  The skin was closed using 4-0 Monocryl and dermabond.  All sponge and needle counts were correct at the end of the case.    Louanna Raw, MD General, Bariatric, & Minimally Invasive Surgery [facility], [place]

## 2020-10-05 NOTE — H&P (Signed)
Admitting Physician: Nickola Major Amely Voorheis  Service: General Surgery  CC: Abdominal pain  Subjective   HPI: Sheri Holland is an 60 y.o. female who is here for abdominal pain found on CT scan to have appendicitis.  She started to have symptoms on Sunday (two days prior to admission).  It started as epigastric/chest pain so she saw the medical care at her work to make sure she wasn't having a heart issue.  Over the last 48 hours the pain transitioned lower in the abdomen and focused around her right lower quadrant.  She has had no appetite, nausea, vomiting, and fever.  She had a bowel movement on Sunday.  No abdominal surgeries before.  Colonoscopy last year looked good.  Past Medical History:  Diagnosis Date  . Anxiety   . 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 BCC (basal cell carcinoma) nodular 01/14/2016   left upper chest  . 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 Depression   . 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 KNEE ARTHROSCOPY W/ MENISCECTOMY    . MELANOMA EXCISION    . TONSILLECTOMY      Family History  Problem Relation Age of Onset  . Colon cancer Neg Hx   . Colon polyps Neg Hx   . Esophageal cancer Neg Hx   . Rectal cancer Neg Hx   . Stomach cancer Neg Hx     Social:  reports that she has never smoked. She has never used smokeless tobacco. She reports that she does not use drugs. No history on file for alcohol use.  Allergies: No Known Allergies  Medications: Current Outpatient Medications  Medication Instructions  . ibuprofen (ADVIL) 800 mg, Oral, Every 8 hours PRN  . LORazepam (ATIVAN) 0.5 MG tablet 0.25-0.5 tablets, Daily PRN  . venlafaxine XR (EFFEXOR-XR) 75 MG 24 hr capsule TAKE 1 CAPSULE BY MOUTH EVERY DAY    ROS - all of the below systems have been reviewed with  the patient and positives are indicated with bold text General: chills, fever or night sweats Eyes: blurry vision or double vision ENT: epistaxis or sore throat Allergy/Immunology: itchy/watery eyes or nasal congestion Hematologic/Lymphatic: bleeding problems, blood clots or swollen lymph nodes Endocrine: temperature intolerance or unexpected weight changes Breast: new or changing breast lumps or nipple discharge Resp: cough, shortness of breath, or wheezing CV: chest pain or dyspnea on exertion GI: as per HPI GU: dysuria, trouble voiding, or hematuria MSK: joint pain or joint stiffness Neuro: TIA or stroke symptoms Derm: pruritus and skin lesion changes Psych: anxiety and depression  Objective   PE Blood pressure 116/67, pulse 82, temperature 100 F (37.8 C), temperature source Oral, resp. rate 16, SpO2 98 %. Constitutional: NAD; conversant; no deformities Eyes: Moist conjunctiva; no lid lag; anicteric; PERRL Neck: Trachea midline; no thyromegaly Lungs: Normal respiratory effort; no tactile fremitus CV: RRR; no palpable thrills; no pitting edema GI: Abd Soft, tender RLQ; no palpable hepatosplenomegaly MSK: Normal range of motion of extremities; no clubbing/cyanosis Psychiatric: Appropriate affect; alert and oriented x3 Lymphatic: No palpable cervical or axillary lymphadenopathy  Results for orders placed or performed during the hospital encounter of 10/05/20 (from the past 24 hour(s))  Lipase, blood     Status: None   Collection Time: 10/05/20 11:44 AM  Result  Value Ref Range   Lipase 23 11 - 51 U/L  Comprehensive metabolic panel     Status: Abnormal   Collection Time: 10/05/20 11:44 AM  Result Value Ref Range   Sodium 136 135 - 145 mmol/L   Potassium 3.6 3.5 - 5.1 mmol/L   Chloride 101 98 - 111 mmol/L   CO2 25 22 - 32 mmol/L   Glucose, Bld 143 (H) 70 - 99 mg/dL   BUN 10 6 - 20 mg/dL   Creatinine, Ser 0.78 0.44 - 1.00 mg/dL   Calcium 9.4 8.9 - 10.3 mg/dL   Total  Protein 6.7 6.5 - 8.1 g/dL   Albumin 3.7 3.5 - 5.0 g/dL   AST 16 15 - 41 U/L   ALT 18 0 - 44 U/L   Alkaline Phosphatase 56 38 - 126 U/L   Total Bilirubin 0.8 0.3 - 1.2 mg/dL   GFR, Estimated >60 >60 mL/min   Anion gap 10 5 - 15  CBC     Status: Abnormal   Collection Time: 10/05/20 11:44 AM  Result Value Ref Range   WBC 20.1 (H) 4.0 - 10.5 K/uL   RBC 4.65 3.87 - 5.11 MIL/uL   Hemoglobin 13.7 12.0 - 15.0 g/dL   HCT 41.2 36.0 - 46.0 %   MCV 88.6 80.0 - 100.0 fL   MCH 29.5 26.0 - 34.0 pg   MCHC 33.3 30.0 - 36.0 g/dL   RDW 12.5 11.5 - 15.5 %   Platelets 228 150 - 400 K/uL   nRBC 0.0 0.0 - 0.2 %  Urinalysis, Routine w reflex microscopic Urine, Catheterized     Status: Abnormal   Collection Time: 10/05/20  5:10 PM  Result Value Ref Range   Color, Urine YELLOW YELLOW   APPearance CLEAR CLEAR   Specific Gravity, Urine 1.024 1.005 - 1.030   pH 5.0 5.0 - 8.0   Glucose, UA NEGATIVE NEGATIVE mg/dL   Hgb urine dipstick LARGE (A) NEGATIVE   Bilirubin Urine NEGATIVE NEGATIVE   Ketones, ur 5 (A) NEGATIVE mg/dL   Protein, ur 30 (A) NEGATIVE mg/dL   Nitrite NEGATIVE NEGATIVE   Leukocytes,Ua NEGATIVE NEGATIVE   RBC / HPF 11-20 0 - 5 RBC/hpf   WBC, UA 0-5 0 - 5 WBC/hpf   Bacteria, UA NONE SEEN NONE SEEN   Squamous Epithelial / LPF 0-5 0 - 5   Mucus PRESENT      Imaging Orders     CT Renal Stone Study   Assessment and Plan   Sheri Holland is an 60 y.o. female with abdominal pain found to have acute appendicitis.  I recommended laparoscopic appendectomy.  The procedure itself as well as its risks, benefits and alternatives were discussed.  We discussed how I felt antibiotics were not a good option due to the [place] on CT.  All questions answered.  She consented to proceed.  We will proceed to OR urgently.    ICD-10-CM   1. Acute appendicitis, unspecified acute appendicitis type  K35.80      Sheri Morn, MD  [facility] Use [email or web address] to contact  on call provider

## 2020-10-05 NOTE — Anesthesia Postprocedure Evaluation (Signed)
Anesthesia Post Note  Patient: Sheri Holland  Procedure(s) Performed: APPENDECTOMY LAPAROSCOPIC (N/A )     Patient location during evaluation: PACU Anesthesia Type: General Level of consciousness: awake Pain management: pain level controlled Vital Signs Assessment: post-procedure vital signs reviewed and stable Respiratory status: spontaneous breathing Cardiovascular status: stable Postop Assessment: no apparent nausea or vomiting Anesthetic complications: no   No complications documented.  Last Vitals:  Vitals:   10/05/20 2130 10/05/20 2145  BP: 118/73 126/80  Pulse: 94 94  Resp: (!) 32 (!) 23  Temp:  (!) 36.4 C  SpO2: 98% 97%    Last Pain:  Vitals:   10/05/20 2130  TempSrc:   PainSc: 0-No pain                 [name]

## 2020-10-05 NOTE — Progress Notes (Signed)
Patient arrrived to 5Q98 , alert and verbally responsive. VS T98.5 BP117/73 PR92 RR16 02sat99%/2L, no DOB/SOB noted. Denies any pain at this moment. lapsites C/D/I. Patient was oriented to call bell and surroundings.call bell within reach and will continue to monitor.

## 2020-10-05 NOTE — Discharge Instructions (Signed)
APPENDECTOMY POST OPERATIVE INSTRUCTIONS  Thinking Clearly  . The anesthesia may cause you to feel different for 1 or 2 days. Do not drive, drink alcohol, or make any big decisions for at least 2 days.  Nutrition . When you wake up, you will be able to drink small amounts of liquid. If you do not feel sick, you can slowly advance your diet to regular foods. . Continue to drink lots of fluids, usually about 8 to 10 glasses per day. . Eat a high-fiber diet so you don't strain during bowel movements. . High-Fiber Foods o Foods high in fiber include beans, bran cereals and whole-grain breads, peas, dried fruit (figs, apricots, and dates), raspberries, blackberries, strawberries, sweet corn, broccoli, baked potatoes with skin, plums, pears, apples, greens, and nuts. Activity . Slowly increase your activity. Be sure to get up and walk every hour or so to prevent blood clots. . No heavy lifting or strenuous activity for 4 weeks following surgery to prevent hernias at your incision sites . It is normal to feel tired. You may need more sleep than usual.  Get your rest but make sure to get up and move around frequently to prevent blood clots and pneumonia.  Work and Return to [employer] . You can go back to work when you feel well enough. Discuss the timing with your surgeon. . You can usually go back to school or work 1 week or less after an operation for an unruptured appendix and up to 2 weeks after a ruptured appendix. . If your work requires heavy lifting or strenuous activity you need to be placed on light duty for 4 weeks following surgery. . You can return to gym class, sports or other physical activities 4 weeks after surgery.  Wound Care . Always wash your hands before and after touching near your incision site. . Do not soak in a bathtub until cleared at your follow up appointment. You may take a shower 24 hours after surgery. . A small amount of drainage from the incision is normal. If the  drainage is thick and yellow or the site is red, you may have an infection, so call your surgeon. . If you have a drain in one of your incisions, it will be taken out in office when the drainage stops. . Steri-Strips will fall off in 7 to 10 days or they will be removed during your first office visit. . If you have dermabond glue covering over the incision, allow the glue to flake off on its own. Marland Kitchen Avoid wearing tight or rough clothing. It may rub your incisions and make it harder for them to heal. . Protect the new skin, especially from the sun. The sun can burn and cause darker scarring. . Your scar will heal in about 4 to 6 weeks and will become softer and continue to fade over the next year.  The cosmetic appearance of the incisions will improve over the course of the first year after surgery. . Sensation around your incision will return in a few weeks or months.  Bowel Movements . After intestinal surgery, you may have loose watery stools for several days. If watery diarrhea lasts longer than 3 days, contact your surgeon. . Pain medication (narcotics) can cause constipation. Increase the fiber in your diet with high-fiber foods if you are constipated. You can take an over the counter stool softener like Colace to avoid constipation.  Additional over the counter medications can also be used if Colace isn't  sufficient (for example, Milk of Magnesia or Miralax).  Pain . The amount of pain is different for each person. Some people need only 1 to 3 doses of pain control medication, while others need more. . Take alternating doses of tylenol and ibuprofen around the clock for the first five days following surgery.  This will provide a baseline of pain control and help with inflammation.  Take the narcotic pain medication in addition if needed for severe pain.  Contact Your Surgeon at [phone], if you have: . Pain that will not go away . Pain that gets worse . A fever of more than 101F  (38.3C) . Repeated vomiting . Swelling, redness, bleeding, or bad-smelling drainage from your wound site . Strong abdominal pain . No bowel movement or unable to pass gas for 3 days . Watery diarrhea lasting longer than 3 days  Pain Control . The goal of pain control is to minimize pain, keep you moving and help you heal. Your surgical team will work with you on your pain plan. Most often a combination of therapies and medications are used to control your pain. You may also be given medication (local anesthetic) at the surgical site. This may help control your pain for several days. . Extreme pain puts extra stress on your body at a time when your body needs to focus on healing. Do not wait until your pain has reached a level "10" or is unbearable before telling your doctor or nurse. It is much easier to control pain before it becomes severe. . Following a laparoscopic procedure, pain is sometimes felt in the shoulder. This is due to the gas inserted into your abdomen during the procedure. Moving and walking helps to decrease the gas and the right shoulder pain.  . Use the guide below for ways to manage your post-operative pain. Learn more by going to facs.org/safepaincontrol.  How Intense Is My Pain Common Therapies to Feel Better       I hardly notice my pain, and it does not interfere with my activities.  I notice my pain and it distracts me, but I can still do activities (sitting up, walking, standing).  Non-Medication Therapies  Ice (in a bag, applied over clothing at the surgical site), elevation, rest, meditation, massage, distraction (music, TV, play) walking and mild exercise Splinting the abdomen with pillows +  Non-Opioid Medications Acetaminophen (Tylenol) Non-steroidal anti-inflammatory drugs (NSAIDS) Aspirin, Ibuprofen (Motrin, Advil) Naproxen (Aleve) Take these as needed, when you feel pain. Both acetaminophen and NSAIDs help to decrease pain and swelling  (inflammation).      My pain is hard to ignore and is more noticeable even when I rest.  My pain interferes with my usual activities.  Non-Medication Therapies  +  Non-Opioid medications  Take on a regular schedule (around-the-clock) instead of as needed. (For example, Tylenol every 6 hours at 9:00 am, 3:00 pm, 9:00 pm, 3:00 am and Motrin every 6 hours at 12:00 am, 6:00 am, 12:00 pm, 6:00 pm)         I am focused on my pain, and I am not doing my daily activities.  I am groaning in pain, and I cannot sleep. I am unable to do anything.  My pain is as bad as it could be, and nothing else matters.  Non-Medication Therapies  +  Around-the-Clock Non-Opioid Medications  +  Short-acting opioids  Opioids should be used with other medications to manage severe pain. Opioids block pain and give a feeling of  euphoria (feel high). Addiction, a serious side effect of opioids, is rare with short-term (a few days) use.  Examples of short-acting opioids include: Tramadol (Ultram), Hydrocodone (Norco, Vicodin), Hydromorphone (Dilaudid), Oxycodone (Oxycontin)     The above directions have been adapted from the [employer] Surgical Patient Education Program.  Please refer to the ACS website if needed: StatOfficial.co.za.   Louanna Raw, MD [facility] [address] [place], [place], [place]  [address] ?  [address], [place], [place]   [address] [phone] ? [phone] ? FAX [phone] Web site: [email or web address]

## 2020-10-05 NOTE — ED Provider Notes (Signed)
[facility] EMERGENCY DEPARTMENT Provider Note   CSN: [id number] Arrival date & time: 10/05/20  1101     History Chief Complaint  Patient presents with  . Abdominal Pain  . Nausea  . Emesis  . Chest Pain    Sheri Holland is a 60 y.o. female.  The history is provided by the patient.  Abdominal Pain Pain location: start epoigastric and general and is now in [place]. Pain quality: tearing   Pain radiates to:  Does not radiate Pain severity:  Severe Onset quality:  Gradual Duration:  3 days Timing:  Constant Progression:  Unchanged Chronicity:  New Context: not sick contacts and not trauma   Relieved by:  Nothing Worsened by:  Nothing Ineffective treatments:  None tried Associated symptoms: fever, nausea and vomiting   Associated symptoms: no chills, no constipation, no cough, no diarrhea, no dysuria and no shortness of breath   Risk factors: has not had multiple surgeries   Emesis Associated symptoms: abdominal pain and fever   Associated symptoms: no arthralgias, no chills, no cough and no diarrhea   Chest Pain Associated symptoms: abdominal pain, fever, nausea and vomiting   Associated symptoms: no cough and no shortness of breath        Past Medical History:  Diagnosis Date  . Anxiety   . 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 BCC (basal cell carcinoma) nodular 01/14/2016   left upper chest  . 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 Depression   . 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   Past Surgical History:  Procedure Laterality Date  . KNEE ARTHROSCOPY W/ MENISCECTOMY    . MELANOMA EXCISION    . TONSILLECTOMY       OB History   No obstetric history on file.     Family History  Problem Relation Age of Onset  . Colon cancer Neg Hx   . Colon polyps  Neg Hx   . Esophageal cancer Neg Hx   . Rectal cancer Neg Hx   . Stomach cancer Neg Hx     Social History   Tobacco Use  . Smoking status: Never Smoker  . Smokeless tobacco: Never Used  Vaping Use  . Vaping Use: Never used  Substance Use Topics  . Drug use: Never    Home Medications Prior to Admission medications   Medication Sig Start Date End Date Taking? Authorizing Provider  venlafaxine XR (EFFEXOR-XR) 75 MG 24 hr capsule TAKE 1 CAPSULE BY MOUTH EVERY DAY Patient taking differently: Take 75 mg by mouth daily. 03/17/20  Yes Thayer Headings, PMHNP  ibuprofen (ADVIL,MOTRIN) 800 MG tablet Take 1 tablet (800 mg total) by mouth every 8 (eight) hours as needed. Patient not taking: No sig reported 10/10/17   Lawyer, Harrell Gave, PA-C  LORazepam (ATIVAN) 0.5 MG tablet Take 0.25-0.5 tablets by mouth daily as needed for anxiety.  Patient not taking: No sig reported 09/10/17   [provider]    Allergies    Patient has no known allergies.  Review of Systems   Review of Systems  Constitutional: Positive for fever. Negative for chills.  HENT: Negative for congestion.   Eyes: Negative for visual disturbance.  Respiratory: Negative for cough and shortness of breath.   Gastrointestinal: Positive for abdominal pain,  nausea and vomiting. Negative for constipation and diarrhea.  Genitourinary: Negative for dysuria.  Musculoskeletal: Negative for arthralgias.  Skin: Negative for rash.  Neurological: Negative for facial asymmetry.  Psychiatric/Behavioral: Negative for agitation.  All other systems reviewed and are negative.   Physical Exam Updated Vital Signs BP 116/67   Pulse 82   Temp 100 F (37.8 C) (Oral)   Resp 16   SpO2 98%   Physical Exam Vitals and nursing note reviewed.  Constitutional:      Appearance: Normal appearance. She is not diaphoretic.  HENT:     Head: Normocephalic and atraumatic.     Nose: Nose normal.  Eyes:     Conjunctiva/sclera:  Conjunctivae normal.     Pupils: Pupils are equal, round, and reactive to light.  Cardiovascular:     Rate and Rhythm: Normal rate and regular rhythm.     Pulses: Normal pulses.     Heart sounds: Normal heart sounds.  Pulmonary:     Effort: Pulmonary effort is normal.     Breath sounds: Normal breath sounds.  Abdominal:     General: Abdomen is flat. Bowel sounds are normal.     Tenderness: There is abdominal tenderness. There is guarding and rebound. Positive signs include Rovsing's sign and McBurney's sign.  Musculoskeletal:        General: Normal range of motion.     Cervical back: Normal range of motion and neck supple.  Skin:    General: Skin is warm and dry.     Capillary Refill: Capillary refill takes less than 2 seconds.  Neurological:     General: No focal deficit present.     Mental Status: She is alert and oriented to person, place, and time.     Deep Tendon Reflexes: Reflexes normal.  Psychiatric:        Mood and Affect: Mood normal.        Behavior: Behavior normal.     ED Results / Procedures / Treatments   Labs (all labs ordered are listed, but only abnormal results are displayed) Results for orders placed or performed during the hospital encounter of 10/05/20  Lipase, blood  Result Value Ref Range   Lipase 23 11 - 51 U/L  Comprehensive metabolic panel  Result Value Ref Range   Sodium 136 135 - 145 mmol/L   Potassium 3.6 3.5 - 5.1 mmol/L   Chloride 101 98 - 111 mmol/L   CO2 25 22 - 32 mmol/L   Glucose, Bld 143 (H) 70 - 99 mg/dL   BUN 10 6 - 20 mg/dL   Creatinine, Ser 1.610.78 0.44 - 1.00 mg/dL   Calcium 9.4 8.9 - 09.610.3 mg/dL   Total Protein 6.7 6.5 - 8.1 g/dL   Albumin 3.7 3.5 - 5.0 g/dL   AST 16 15 - 41 U/L   ALT 18 0 - 44 U/L   Alkaline Phosphatase 56 38 - 126 U/L   Total Bilirubin 0.8 0.3 - 1.2 mg/dL   GFR, Estimated >04>60 >54>60 mL/min   Anion gap 10 5 - 15  CBC  Result Value Ref Range   WBC 20.1 (H) 4.0 - 10.5 K/uL   RBC 4.65 3.87 - 5.11 MIL/uL    Hemoglobin 13.7 12.0 - 15.0 g/dL   HCT 09.841.2 11.936.0 - 14.746.0 %   MCV 88.6 80.0 - 100.0 fL   MCH 29.5 26.0 - 34.0 pg   MCHC 33.3 30.0 - 36.0 g/dL   RDW 82.912.5 56.211.5 - 13.015.5 %  Platelets 228 150 - 400 K/uL   nRBC 0.0 0.0 - 0.2 %  Urinalysis, Routine w reflex microscopic Urine, Catheterized  Result Value Ref Range   Color, Urine YELLOW YELLOW   APPearance CLEAR CLEAR   Specific Gravity, Urine 1.024 1.005 - 1.030   pH 5.0 5.0 - 8.0   Glucose, UA NEGATIVE NEGATIVE mg/dL   Hgb urine dipstick LARGE (A) NEGATIVE   Bilirubin Urine NEGATIVE NEGATIVE   Ketones, ur 5 (A) NEGATIVE mg/dL   Protein, ur 30 (A) NEGATIVE mg/dL   Nitrite NEGATIVE NEGATIVE   Leukocytes,Ua NEGATIVE NEGATIVE   RBC / HPF 11-20 0 - 5 RBC/hpf   WBC, UA 0-5 0 - 5 WBC/hpf   Bacteria, UA NONE SEEN NONE SEEN   Squamous Epithelial / LPF 0-5 0 - 5   Mucus PRESENT    CT Renal Stone Study  Result Date: 10/05/2020 CLINICAL DATA:  Nausea and vomiting with flank pain, initial encounter EXAM: CT ABDOMEN AND PELVIS WITHOUT CONTRAST TECHNIQUE: Multidetector CT imaging of the abdomen and pelvis was performed following the standard protocol without IV contrast. COMPARISON:  10/10/2017 FINDINGS: Lower chest: No acute abnormality. Hepatobiliary: No focal liver abnormality is seen. No gallstones, gallbladder wall thickening, or biliary dilatation. Pancreas: Unremarkable. No pancreatic ductal dilatation or surrounding inflammatory changes. Spleen: Normal in size without focal abnormality. Adrenals/Urinary Tract: Adrenal glands are within normal limits. No renal calculi or obstructive changes are noted. Increased peripelvic cysts are noted bilaterally right greater than left. Ureters are within normal limits. The bladder is decompressed. Stomach/Bowel: Colon is predominately decompressed. No focal inflammatory changes are identified. No obstructive changes are seen. The appendix is prominent measuring up to 13 mm with multiple appendicoliths within.  Surrounding inflammatory changes are identified consistent with acute appendicitis. These predominately are deep within the pelvis on the right. Small bowel and stomach appear within normal limits. Vascular/Lymphatic: Aortic atherosclerosis. No enlarged abdominal or pelvic lymph nodes. Reproductive: Uterus and bilateral adnexa are unremarkable. Other: No abdominal wall hernia or abnormality. No abdominopelvic ascites. Musculoskeletal: No acute or significant osseous findings. IMPRESSION: Changes consistent with acute appendicitis with appendicoliths within. These changes are deep within the right hemipelvis. Inflammatory change is seen although no discrete abscess is noted. Prominent parapelvic cysts are noted in the kidneys bilaterally increased when compared with the prior exam. Electronically Signed   By: Inez Catalina M.D.   On: 10/05/2020 18:04    Radiology CT Renal Stone Study  Result Date: 10/05/2020 CLINICAL DATA:  Nausea and vomiting with flank pain, initial encounter EXAM: CT ABDOMEN AND PELVIS WITHOUT CONTRAST TECHNIQUE: Multidetector CT imaging of the abdomen and pelvis was performed following the standard protocol without IV contrast. COMPARISON:  10/10/2017 FINDINGS: Lower chest: No acute abnormality. Hepatobiliary: No focal liver abnormality is seen. No gallstones, gallbladder wall thickening, or biliary dilatation. Pancreas: Unremarkable. No pancreatic ductal dilatation or surrounding inflammatory changes. Spleen: Normal in size without focal abnormality. Adrenals/Urinary Tract: Adrenal glands are within normal limits. No renal calculi or obstructive changes are noted. Increased peripelvic cysts are noted bilaterally right greater than left. Ureters are within normal limits. The bladder is decompressed. Stomach/Bowel: Colon is predominately decompressed. No focal inflammatory changes are identified. No obstructive changes are seen. The appendix is prominent measuring up to 13 mm with multiple  appendicoliths within. Surrounding inflammatory changes are identified consistent with acute appendicitis. These predominately are deep within the pelvis on the right. Small bowel and stomach appear within normal limits. Vascular/Lymphatic: Aortic atherosclerosis. No enlarged abdominal  or pelvic lymph nodes. Reproductive: Uterus and bilateral adnexa are unremarkable. Other: No abdominal wall hernia or abnormality. No abdominopelvic ascites. Musculoskeletal: No acute or significant osseous findings. IMPRESSION: Changes consistent with acute appendicitis with appendicoliths within. These changes are deep within the right hemipelvis. Inflammatory change is seen although no discrete abscess is noted. Prominent parapelvic cysts are noted in the kidneys bilaterally increased when compared with the prior exam. Electronically Signed   By: Inez Catalina M.D.   On: 10/05/2020 18:04    Procedures Procedures   Medications Ordered in ED Medications  cefTRIAXone (ROCEPHIN) 2 g in sodium chloride 0.9 % 100 mL IVPB (has no administration in time range)    And  metroNIDAZOLE (FLAGYL) IVPB 500 mg (has no administration in time range)  0.9 %  sodium chloride infusion (has no administration in time range)  fentaNYL (SUBLIMAZE) injection 50 mcg (50 mcg Intravenous Given 10/05/20 1727)  ondansetron (ZOFRAN) injection 4 mg (4 mg Intravenous Given 10/05/20 1728)  sodium chloride 0.9 % bolus 500 mL (500 mLs Intravenous New Bag/Given 10/05/20 1744)    ED Course  I have reviewed the triage vital signs and the nursing notes.  Pertinent labs & imaging results that were available during my care of the patient were reviewed by me and considered in my medical decision making (see chart for details).    638 Case d/w gneral surgery who will see patient  Final Clinical Impression(s) / ED Diagnoses Final diagnoses:  Acute appendicitis, unspecified acute appendicitis type    Admit to surgery    Hildur Bayer, MD 10/05/20  1840

## 2020-10-06 ENCOUNTER — Encounter (HOSPITAL_COMMUNITY): Payer: Self-pay | Admitting: Surgery

## 2020-10-06 LAB — BASIC METABOLIC PANEL
Anion gap: 6 (ref 5–15)
BUN: 9 mg/dL (ref 6–20)
CO2: 25 mmol/L (ref 22–32)
Calcium: 8.6 mg/dL — ABNORMAL LOW (ref 8.9–10.3)
Chloride: 106 mmol/L (ref 98–111)
Creatinine, Ser: 0.74 mg/dL (ref 0.44–1.00)
GFR, Estimated: >60 mL/min (ref >60)
Glucose, Bld: 144 mg/dL — ABNORMAL HIGH (ref 70–99)
Potassium: 4.1 mmol/L (ref 3.5–5.1)
Sodium: 137 mmol/L (ref 135–145)

## 2020-10-06 LAB — CBC
HCT: 38.8 % (ref 36.0–46.0)
Hemoglobin: 12.8 g/dL (ref 12.0–15.0)
MCH: 30.2 pg (ref 26.0–34.0)
MCHC: 33 g/dL (ref 30.0–36.0)
MCV: 91.5 fL (ref 80.0–100.0)
Platelets: 188 10*3/uL (ref 150–400)
RBC: 4.24 MIL/uL (ref 3.87–5.11)
RDW: 12.5 % (ref 11.5–15.5)
WBC: 15.2 10*3/uL — ABNORMAL HIGH (ref 4.0–10.5)
nRBC: 0 % (ref 0.0–0.2)

## 2020-10-06 LAB — HIV ANTIBODY (ROUTINE TESTING W REFLEX): HIV Screen 4th Generation wRfx: NONREACTIVE

## 2020-10-06 MED ORDER — OXYCODONE HCL 5 MG PO TABS
5.0000 mg | ORAL_TABLET | ORAL | Status: DC | PRN
  Administered 2020-10-06: 10 mg via ORAL
  Administered 2020-10-06: 5 mg via ORAL
  Filled 2020-10-06: qty 2
  Filled 2020-10-06: qty 1

## 2020-10-06 MED ORDER — ONDANSETRON HCL 4 MG PO TABS: 4.0000 mg | ORAL_TABLET | Freq: Four times a day (QID) | ORAL | Status: DC | PRN

## 2020-10-06 MED ORDER — DEXTROSE-NACL 5-0.9 % IV SOLN: INTRAVENOUS | Status: DC

## 2020-10-06 MED ORDER — DOCUSATE SODIUM 100 MG PO CAPS
100.0000 mg | ORAL_CAPSULE | Freq: Two times a day (BID) | ORAL | Status: DC
  Administered 2020-10-06 (×2): 100 mg via ORAL
  Filled 2020-10-06 (×3): qty 1

## 2020-10-06 MED ORDER — SODIUM CHLORIDE 0.9 % IV BOLUS
500.0000 mL | Freq: Once | INTRAVENOUS | Status: AC
  Administered 2020-10-06: 500 mL via INTRAVENOUS

## 2020-10-06 MED ORDER — POLYETHYLENE GLYCOL 3350 17 G PO PACK: 17.0000 g | PACK | Freq: Every day | ORAL | Status: DC | PRN

## 2020-10-06 MED ORDER — ONDANSETRON HCL 4 MG/2ML IJ SOLN
4.0000 mg | Freq: Four times a day (QID) | INTRAMUSCULAR | Status: DC | PRN
  Administered 2020-10-06: 4 mg via INTRAVENOUS
  Filled 2020-10-06: qty 2

## 2020-10-06 MED ORDER — SODIUM CHLORIDE 0.9 % IV SOLN
12.5000 mg | Freq: Four times a day (QID) | INTRAVENOUS | Status: DC | PRN
  Filled 2020-10-06: qty 0.5

## 2020-10-06 NOTE — Plan of Care (Signed)

## 2020-10-06 NOTE — Progress Notes (Signed)
[facility] Progress Note  1 Day Post-Op  Subjective: CC-  Feeling better than prior to surgery, but abdomen very sore. She got up earlier this morning and felt lightheaded and nauseated. Somewhat improved by lying back down. No emesis. No flatus or BM. Has only had a few sips of water.  Objective: Vital signs in last 24 hours: Temp:  [97.5 F (36.4 C)-100 F (37.8 C)] 98.2 F (36.8 C) (05/18 0510) Pulse Rate:  [58-106] 58 (05/18 0510) Resp:  [15-32] 16 (05/18 0510) BP: (112-126)/(67-80) 118/75 (05/18 0510) SpO2:  [94 %-100 %] 100 % (05/18 0510) Last BM Date: 10/04/20  Intake/Output from previous day: 05/17 0701 - 05/18 0700 In: 2770.1 [P.O.:240; I.V.:1930.1; IV Piggyback:600] Out: 10 [Blood:10] Intake/Output this shift: No intake/output data recorded.  PE: Gen:  Alert, NAD, pleasant HEENT: EOM's intact, pupils equal and round Card:  RRR Pulm:  CTAB, no W/R/R, rate and effort normal Abd: Soft, mild distension, mild TTP over incisions, +BS, lap incisions C/D/I  Lab Results:  Recent Labs    10/05/20 1144  WBC 20.1*  HGB 13.7  HCT 41.2  PLT 228   BMET Recent Labs    10/05/20 1144  NA 136  K 3.6  CL 101  CO2 25  GLUCOSE 143*  BUN 10  CREATININE 0.78  CALCIUM 9.4   PT/INR No results for input(s): LABPROT, INR in the last 72 hours. CMP     Component Value Date/Time   NA 136 10/05/2020 1144   K 3.6 10/05/2020 1144   CL 101 10/05/2020 1144   CO2 25 10/05/2020 1144   GLUCOSE 143 (H) 10/05/2020 1144   BUN 10 10/05/2020 1144   CREATININE 0.78 10/05/2020 1144   CALCIUM 9.4 10/05/2020 1144   PROT 6.7 10/05/2020 1144   ALBUMIN 3.7 10/05/2020 1144   AST 16 10/05/2020 1144   ALT 18 10/05/2020 1144   ALKPHOS 56 10/05/2020 1144   BILITOT 0.8 10/05/2020 1144   GFRNONAA >60 10/05/2020 1144   GFRAA >60 10/10/2017 1236   Lipase     Component Value Date/Time   LIPASE 23 10/05/2020 1144       Studies/Results: CT Renal Stone Study  Result  Date: 10/05/2020 CLINICAL DATA:  Nausea and vomiting with flank pain, initial encounter EXAM: CT ABDOMEN AND PELVIS WITHOUT CONTRAST TECHNIQUE: Multidetector CT imaging of the abdomen and pelvis was performed following the standard protocol without IV contrast. COMPARISON:  10/10/2017 FINDINGS: Lower chest: No acute abnormality. Hepatobiliary: No focal liver abnormality is seen. No gallstones, gallbladder wall thickening, or biliary dilatation. Pancreas: Unremarkable. No pancreatic ductal dilatation or surrounding inflammatory changes. Spleen: Normal in size without focal abnormality. Adrenals/Urinary Tract: Adrenal glands are within normal limits. No renal calculi or obstructive changes are noted. Increased peripelvic cysts are noted bilaterally right greater than left. Ureters are within normal limits. The bladder is decompressed. Stomach/Bowel: Colon is predominately decompressed. No focal inflammatory changes are identified. No obstructive changes are seen. The appendix is prominent measuring up to 13 mm with multiple appendicoliths within. Surrounding inflammatory changes are identified consistent with acute appendicitis. These predominately are deep within the pelvis on the right. Small bowel and stomach appear within normal limits. Vascular/Lymphatic: Aortic atherosclerosis. No enlarged abdominal or pelvic lymph nodes. Reproductive: Uterus and bilateral adnexa are unremarkable. Other: No abdominal wall hernia or abnormality. No abdominopelvic ascites. Musculoskeletal: No acute or significant osseous findings. IMPRESSION: Changes consistent with acute appendicitis with appendicoliths within. These changes are deep within the right hemipelvis. Inflammatory  change is seen although no discrete abscess is noted. Prominent parapelvic cysts are noted in the kidneys bilaterally increased when compared with the prior exam. Electronically Signed   By: Inez Catalina M.D.   On: 10/05/2020 18:04     Anti-infectives: Anti-infectives (From admission, onward)   Start     Dose/Rate Route Frequency Ordered Stop   10/05/20 1845  cefTRIAXone (ROCEPHIN) 2 g in sodium chloride 0.9 % 100 mL IVPB       "And" Linked Group Details   2 g 200 mL/hr over 30 Minutes Intravenous  Once 10/05/20 1834 10/05/20 2053   10/05/20 1845  metroNIDAZOLE (FLAGYL) IVPB 500 mg       "And" Linked Group Details   500 mg 100 mL/hr over 60 Minutes Intravenous  Once 10/05/20 1834 10/05/20 2003       Assessment/Plan  Acute gangrenous appendicitis S/p laparoscopic appendectomy 5/17 Dr. Lanny Hurst - POD#1 - Check CBC/BMP. Change IVF as below and give 500cc bolus. FLD as tolerated. Mobilize with assistance. Will recheck later today for possible discharge.   ID - rocephin/flagyl preop FEN - change IVF to dextrose/NaCl @ 125cc/hr, FLD VTE - SCDs, lovenox Foley - none Follow up - Dr. Lanny Hurst   LOS: 0 days    Wellington Hampshire, [facility] 10/06/2020, 8:36 AM Please see Amion for pager number during day hours 7:00am-4:30pm

## 2020-10-07 LAB — SURGICAL PATHOLOGY

## 2020-10-07 MED ORDER — ONDANSETRON HCL 4 MG PO TABS: 4.0000 mg | ORAL_TABLET | Freq: Four times a day (QID) | ORAL | 0 refills | Status: AC | PRN

## 2020-10-07 NOTE — Plan of Care (Signed)
  Problem: Education: Goal: Knowledge of General Education information will improve Description: Including pain rating scale, medication(s)/side effects and non-pharmacologic comfort measures 10/07/2020 1003 by Camillia Herter, RN Outcome: Adequate for Discharge 10/07/2020 0756 by Camillia Herter, RN Outcome: Progressing   Problem: Health Behavior/Discharge Planning: Goal: Ability to manage health-related needs will improve 10/07/2020 1003 by Camillia Herter, RN Outcome: Adequate for Discharge 10/07/2020 0756 by Camillia Herter, RN Outcome: Progressing   Problem: Clinical Measurements: Goal: Ability to maintain clinical measurements within normal limits will improve 10/07/2020 1003 by Camillia Herter, RN Outcome: Adequate for Discharge 10/07/2020 0756 by Camillia Herter, RN Outcome: Progressing Goal: Will remain free from infection 10/07/2020 1003 by Camillia Herter, RN Outcome: Adequate for Discharge 10/07/2020 0756 by Camillia Herter, RN Outcome: Progressing Goal: Diagnostic test results will improve 10/07/2020 1003 by Camillia Herter, RN Outcome: Adequate for Discharge 10/07/2020 0756 by Camillia Herter, RN Outcome: Progressing Goal: Respiratory complications will improve 10/07/2020 1003 by Camillia Herter, RN Outcome: Adequate for Discharge 10/07/2020 0756 by Camillia Herter, RN Outcome: Progressing Goal: Cardiovascular complication will be avoided 10/07/2020 1003 by Camillia Herter, RN Outcome: Adequate for Discharge 10/07/2020 0756 by Camillia Herter, RN Outcome: Progressing   Problem: Activity: Goal: Risk for activity intolerance will decrease 10/07/2020 1003 by Camillia Herter, RN Outcome: Adequate for Discharge 10/07/2020 0756 by Camillia Herter, RN Outcome: Progressing   Problem: Nutrition: Goal: Adequate nutrition will be maintained 10/07/2020 1003 by Camillia Herter, RN Outcome: Adequate for Discharge 10/07/2020 0756 by Camillia Herter, RN Outcome:  Progressing   Problem: Coping: Goal: Level of anxiety will decrease 10/07/2020 1003 by Camillia Herter, RN Outcome: Adequate for Discharge 10/07/2020 0756 by Camillia Herter, RN Outcome: Progressing   Problem: Elimination: Goal: Will not experience complications related to bowel motility 10/07/2020 1003 by Camillia Herter, RN Outcome: Adequate for Discharge 10/07/2020 0756 by Camillia Herter, RN Outcome: Progressing Goal: Will not experience complications related to urinary retention 10/07/2020 1003 by Camillia Herter, RN Outcome: Adequate for Discharge 10/07/2020 0756 by Camillia Herter, RN Outcome: Progressing   Problem: Pain Managment: Goal: General experience of comfort will improve 10/07/2020 1003 by Camillia Herter, RN Outcome: Adequate for Discharge 10/07/2020 0756 by Camillia Herter, RN Outcome: Progressing   Problem: Safety: Goal: Ability to remain free from injury will improve 10/07/2020 1003 by Camillia Herter, RN Outcome: Adequate for Discharge 10/07/2020 0756 by Camillia Herter, RN Outcome: Progressing   Problem: Skin Integrity: Goal: Risk for impaired skin integrity will decrease 10/07/2020 1003 by Camillia Herter, RN Outcome: Adequate for Discharge 10/07/2020 0756 by Camillia Herter, RN Outcome: Progressing

## 2020-10-07 NOTE — Discharge Summary (Signed)
[facility] Discharge Summary   Patient ID: Sheri Holland MRN: [id number] DOB/AGE: 61-Feb-1962 61 y.o.  Admit date: 10/05/2020 Discharge date: 10/07/2020  Admitting Diagnosis: Acute appendicitis  Discharge Diagnosis Patient Active Problem List   Diagnosis Date Noted  . Acute appendicitis 10/05/2020    Consultants None  Imaging: CT Renal Stone Study  Result Date: 10/05/2020 CLINICAL DATA:  Nausea and vomiting with flank pain, initial encounter EXAM: CT ABDOMEN AND PELVIS WITHOUT CONTRAST TECHNIQUE: Multidetector CT imaging of the abdomen and pelvis was performed following the standard protocol without IV contrast. COMPARISON:  10/10/2017 FINDINGS: Lower chest: No acute abnormality. Hepatobiliary: No focal liver abnormality is seen. No gallstones, gallbladder wall thickening, or biliary dilatation. Pancreas: Unremarkable. No pancreatic ductal dilatation or surrounding inflammatory changes. Spleen: Normal in size without focal abnormality. Adrenals/Urinary Tract: Adrenal glands are within normal limits. No renal calculi or obstructive changes are noted. Increased peripelvic cysts are noted bilaterally right greater than left. Ureters are within normal limits. The bladder is decompressed. Stomach/Bowel: Colon is predominately decompressed. No focal inflammatory changes are identified. No obstructive changes are seen. The appendix is prominent measuring up to 13 mm with multiple appendicoliths within. Surrounding inflammatory changes are identified consistent with acute appendicitis. These predominately are deep within the pelvis on the right. Small bowel and stomach appear within normal limits. Vascular/Lymphatic: Aortic atherosclerosis. No enlarged abdominal or pelvic lymph nodes. Reproductive: Uterus and bilateral adnexa are unremarkable. Other: No abdominal wall hernia or abnormality. No abdominopelvic ascites. Musculoskeletal: No acute or significant osseous findings. IMPRESSION:  Changes consistent with acute appendicitis with appendicoliths within. These changes are deep within the right hemipelvis. Inflammatory change is seen although no discrete abscess is noted. Prominent parapelvic cysts are noted in the kidneys bilaterally increased when compared with the prior exam. Electronically Signed   By: Inez Catalina M.D.   On: 10/05/2020 18:04    Procedures Dr. Lanny Hurst (10/05/2020) - Laparoscopic Appendectomy  Hospital Course:  Sheri Holland is a 60yo female who presented to [facility] 5/17 with 2 days of epigastric/ chest pain that ultimately transitioned to her RLQ.  Workup showed acute appendicitis.  Patient was admitted and underwent procedure listed above.  Tolerated procedure well and was transferred to the floor.  Diet was advanced as tolerated.  She did have some nausea and dizziness on POD#1 which resolved with rehydration and time. On POD2, the patient was voiding well, tolerating diet, ambulating well, pain well controlled, vital signs stable, incisions c/d/i and felt stable for discharge home.  Patient will follow up as below and knows to call with questions or concerns.    I have personally reviewed the patients medication history on the  controlled substance database.    Physical Exam: Gen:  Alert, NAD, pleasant Pulm:  rate and effort normal Abd: Soft, minimal distension, nontender, +BS, lap incisions C/D/I  Allergies as of 10/07/2020   No Known Allergies     Medication List    STOP taking these medications   LORazepam 0.5 MG tablet Commonly known as: ATIVAN     TAKE these medications   ibuprofen 800 MG tablet Commonly known as: ADVIL Take 1 tablet (800 mg total) by mouth every 8 (eight) hours for 7 days. What changed:   when to take this  reasons to take this   ondansetron 4 MG tablet Commonly known as: ZOFRAN Take 1 tablet (4 mg total) by mouth every 6 (six) hours as needed for nausea or vomiting.   oxyCODONE-acetaminophen 5-325 MG  tablet Commonly known as: Percocet Take 1 tablet by mouth every 4 (four) hours as needed for severe pain.   venlafaxine XR 75 MG 24 hr capsule Commonly known as: EFFEXOR-XR TAKE 1 CAPSULE BY MOUTH EVERY DAY What changed: how much to take         Follow-up Information    Stechschulte, Nickola Major, MD. Go on 10/27/2020.   Specialty: Surgery Why: Your appointment is 10/27/20 at 2:20 pm.  Please arrive 30 minutes prior to your appointment to check in and fill out paperwork. Bring photo ID and insurance information. Contact information: [place]. [address] [address] [phone]               Signed: Wellington Hampshire, [facility] 10/07/2020, 8:11 AM Please see Amion for pager number during day hours 7:00am-4:30pm

## 2020-10-07 NOTE — Progress Notes (Signed)
Sheri Holland to be D/C'd  per MD order. Discussed with the patient and all questions fully answered.  VSS, Skin clean, dry and intact without evidence of skin break down, no evidence of skin tears noted.  IV catheter discontinued intact. Site without signs and symptoms of complications. Dressing and pressure applied.  An After Visit Summary was printed and given to the patient. Patient received prescription.  D/c education completed with patient/family including follow up instructions, medication list, d/c activities limitations if indicated, with other d/c instructions as indicated by MD - patient able to verbalize understanding, all questions fully answered.   Patient instructed to return to ED, call 911, or call MD for any changes in condition.   Patient to be escorted via [place], and D/C home via private auto.

## 2020-10-07 NOTE — Plan of Care (Signed)

## 2021-07-06 ENCOUNTER — Other Ambulatory Visit: Payer: Self-pay | Admitting: Orthopedic Surgery

## 2021-07-06 DIAGNOSIS — M25562 Pain in left knee: Secondary | ICD-10-CM

## 2021-07-18 ENCOUNTER — Other Ambulatory Visit: Payer: Self-pay

## 2021-07-18 ENCOUNTER — Ambulatory Visit
Admission: RE | Admit: 2021-07-18 | Discharge: 2021-07-18 | Disposition: A | Discharge status: Home / Self Care | Payer: 59 | Source: Ambulatory Visit | Attending: Orthopedic Surgery | Admitting: Orthopedic Surgery

## 2021-07-18 DIAGNOSIS — M25562 Pain in left knee: Secondary | ICD-10-CM

## 2021-09-27 ENCOUNTER — Other Ambulatory Visit: Payer: Self-pay | Admitting: Orthopedic Surgery

## 2021-09-27 DIAGNOSIS — M7989 Other specified soft tissue disorders: Secondary | ICD-10-CM

## 2021-09-27 DIAGNOSIS — Z9889 Other specified postprocedural states: Secondary | ICD-10-CM

## 2021-09-28 ENCOUNTER — Ambulatory Visit
Admission: RE | Admit: 2021-09-28 | Discharge: 2021-09-28 | Disposition: A | Discharge status: Home / Self Care | Payer: 59 | Source: Ambulatory Visit | Attending: Orthopedic Surgery | Admitting: Orthopedic Surgery

## 2021-09-28 DIAGNOSIS — M7989 Other specified soft tissue disorders: Secondary | ICD-10-CM

## 2021-09-28 DIAGNOSIS — Z9889 Other specified postprocedural states: Secondary | ICD-10-CM

## 2022-06-21 IMAGING — MR MR KNEE*L* W/O CM
4 of 6 series · 22 of 40 positions shown · non-contrast
Comparison: None.

CLINICAL DATA: Knee pain for 3 months. Prior history of meniscal
surgery.

EXAM:
MRI OF THE LEFT KNEE WITHOUT CONTRAST
TECHNIQUE: Multiplanar, multisequence MR imaging of the knee was performed. No
intravenous contrast was administered.

[Series 3: T2 fat-sat · axial · 4.0mm · 0.50mm/px · z∈[-100,-5]mm · 5 of 24 slices shown (1 of 2)]
[im 1/24]
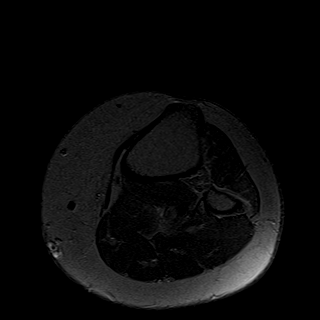
[im 4/24]
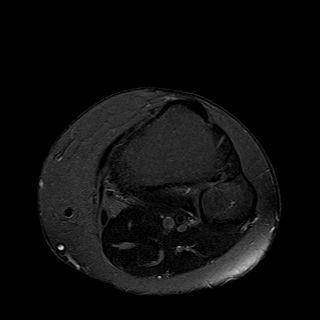
[im 8/24]
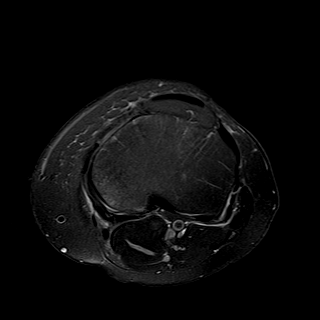
[im 12/24]
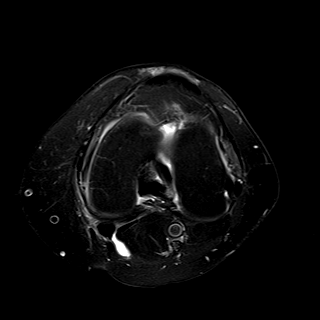
[im 20/24]
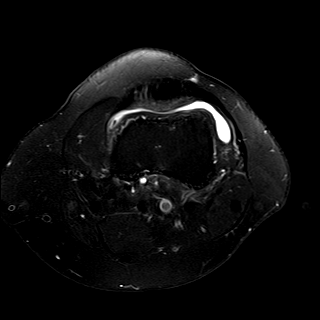

[Series 5: T2 fat-sat · coronal · 4.0mm · 0.29mm/px · 3 of 23 slices shown (2 of 2)]
[im 5/23]
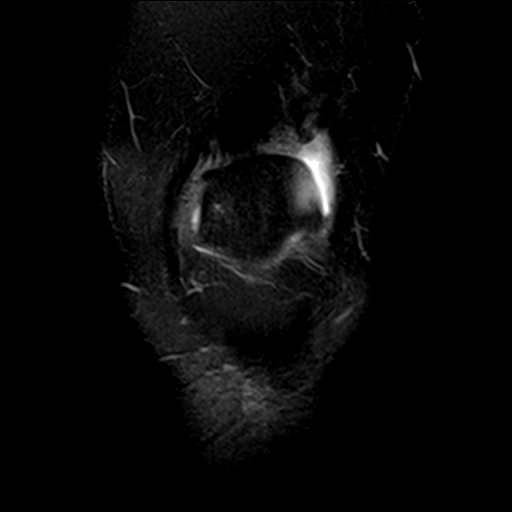
[im 14/23]
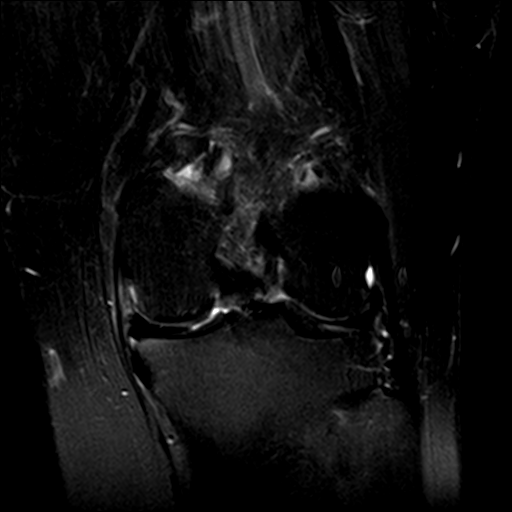
[im 23/23]
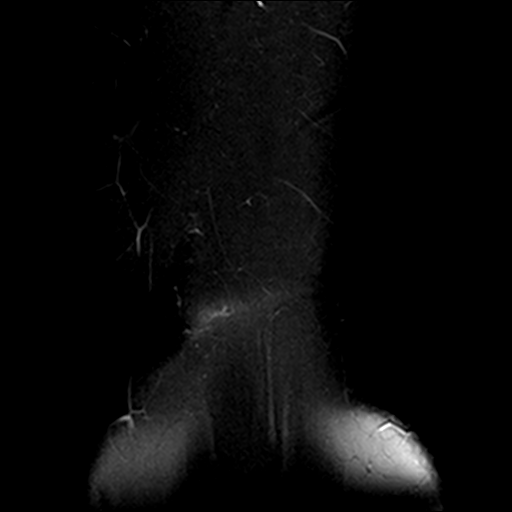

[Series 7: PD fat-sat · sagittal · 3.0mm · 0.29mm/px · 7 of 27 slices shown (1 of 2)]
[im 1/27]
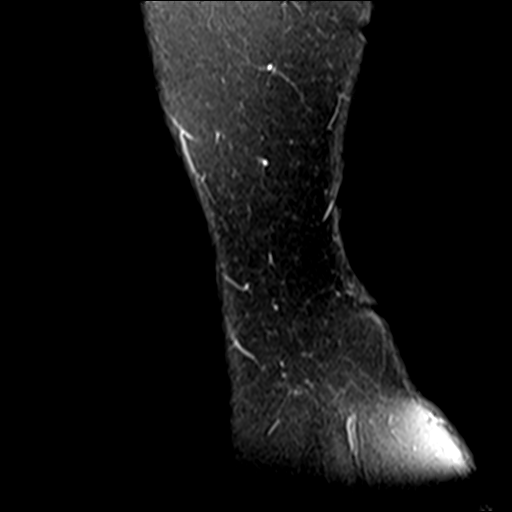
[im 5/27]
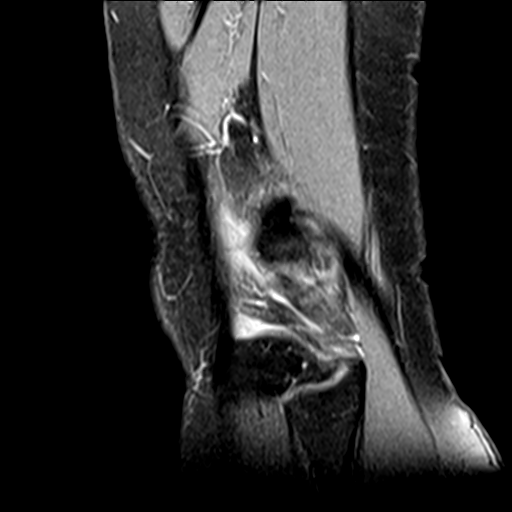
[im 9/27]
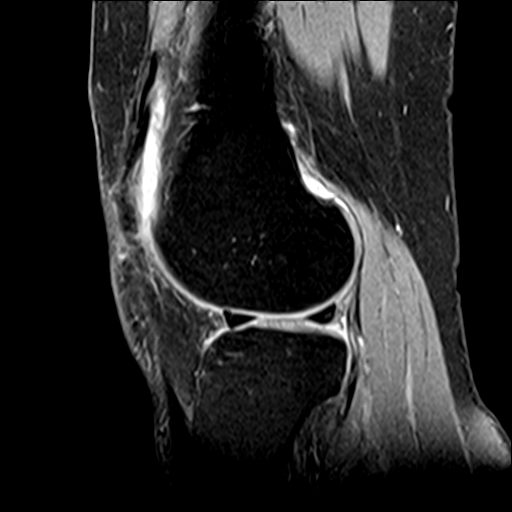
[im 14/27]
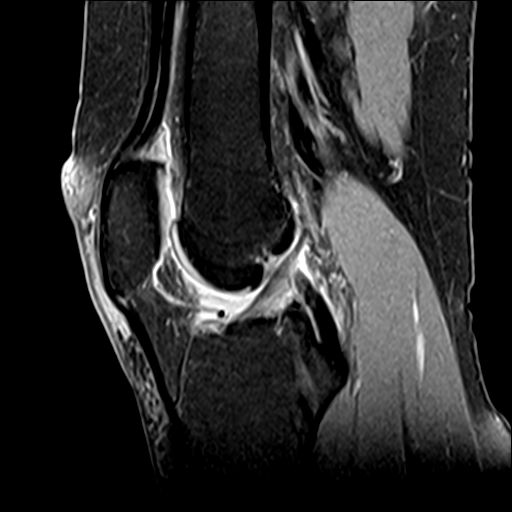
[im 18/27]
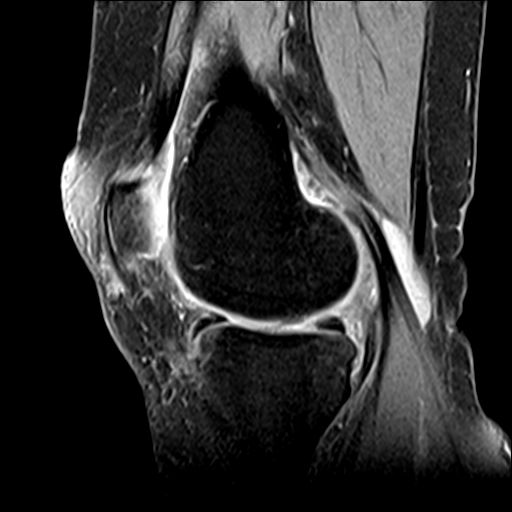
[im 22/27]
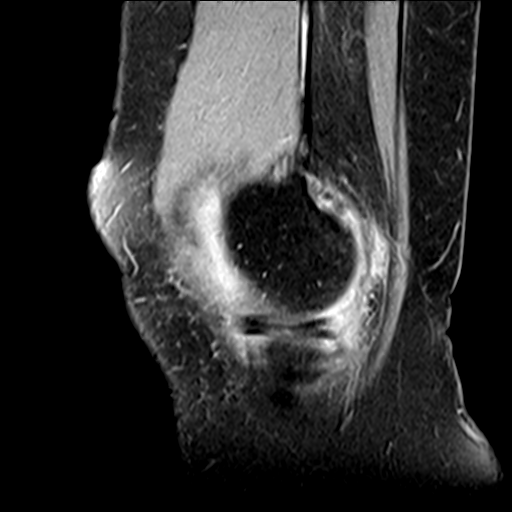
[im 27/27]
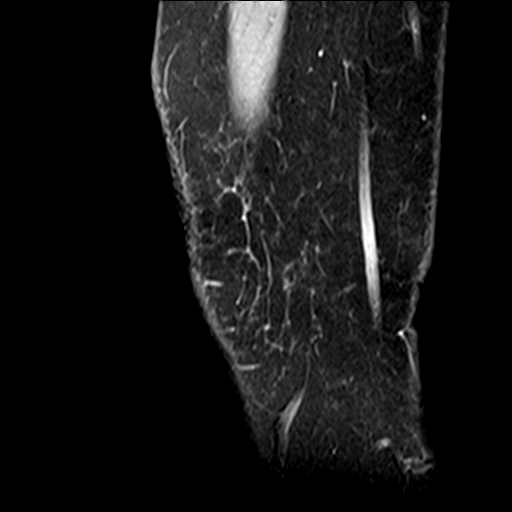

[Series 8: PD fat-sat · coronal · 3.0mm · 0.29mm/px · 7 of 28 slices shown (2 of 2)]
[im 1/28]
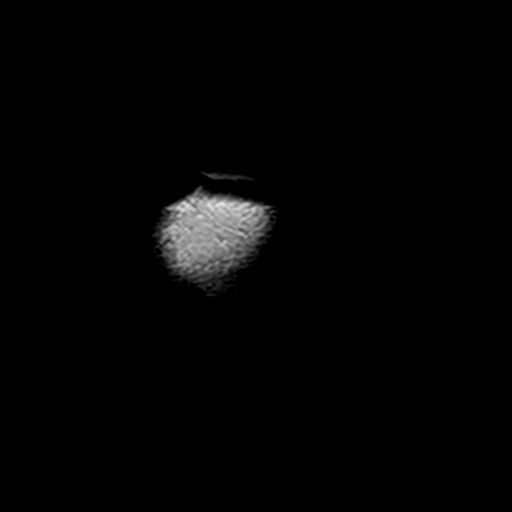
[im 5/28]
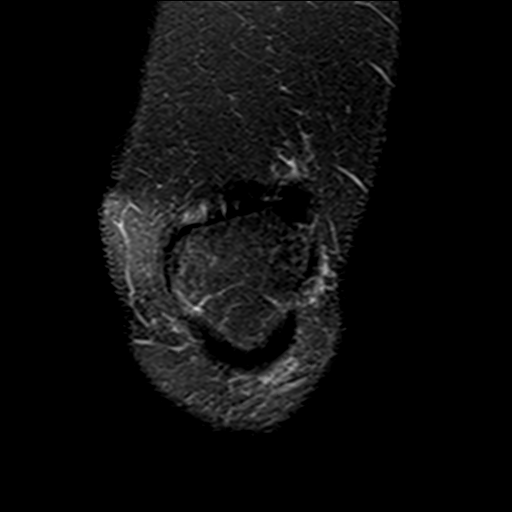
[im 10/28]
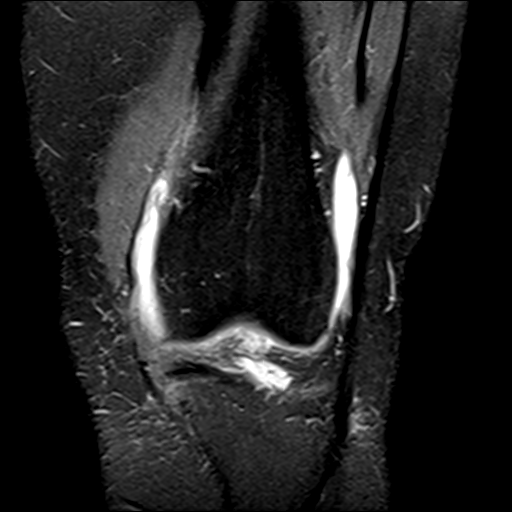
[im 14/28]
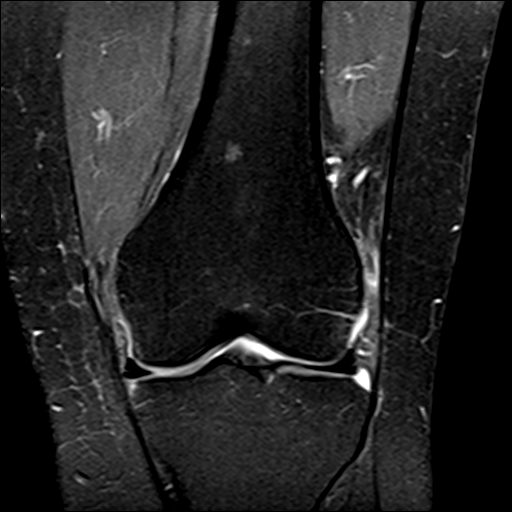
[im 19/28]
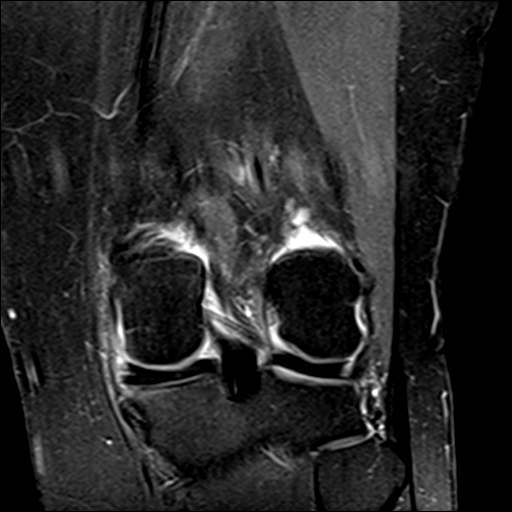
[im 23/28]
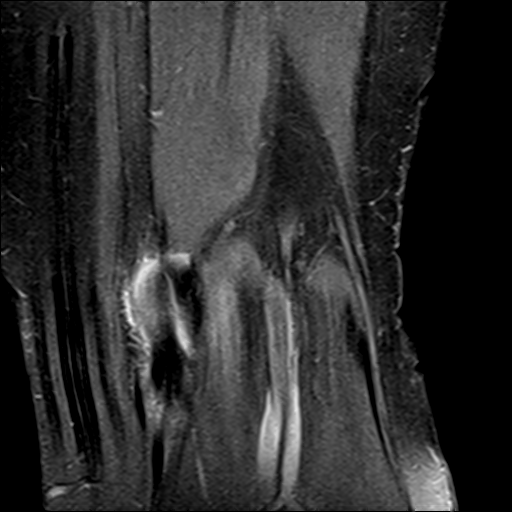
[im 28/28]
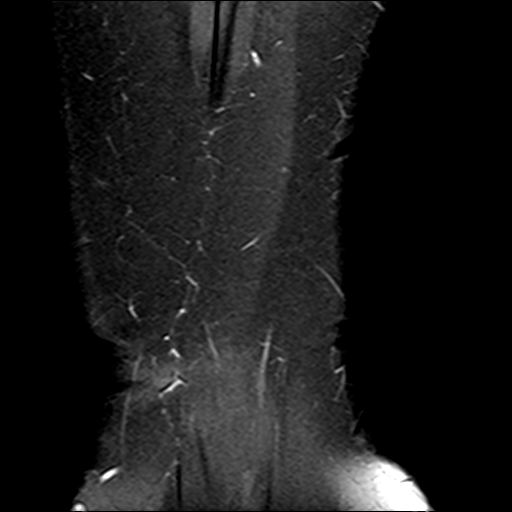

[22 of 40 positions shown; findings below may reference images not displayed]

FINDINGS: MENISCI

Medial meniscus: Small free edge/radial tear involving the posterior
horn near the meniscal root. There is also oblique coursing high T2
signal intensity involving the periphery of the posterior horn mid
body junction region. This could be a small oblique coursing tear or
postoperative change.

Lateral meniscus:  Intact

LIGAMENTS

Cruciates:  Intact

Collaterals:  Intact

CARTILAGE

Patellofemoral:  Mild degenerative chondrosis.

Medial: Mild to moderate degenerative chondrosis with early joint
space narrowing.

Lateral:  Mild degenerative chondrosis.

Joint: Small joint effusion. Mild thickening and inflammation of the
quadriceps fat pad.

Popliteal Fossa:  Small Baker's cyst.

Extensor Mechanism: The patella retinacular structures are intact
and the quadriceps and patellar tendons are intact.

Bones:  No acute bony findings.

Other: Unremarkable knee musculature.
IMPRESSION: 1. Areas of signal abnormality involving the medial meniscus as
detailed above. Findings could be due to postoperative
change/meniscal repair or tears. Recommend correlation with exact
surgical history. MR arthrography may be helpful for further
evaluation if necessary.
2. Intact ligamentous structures and no acute bony findings.
3. Mild tricompartmental degenerative chondrosis.
4. Small joint effusion and small Baker's cyst.

## 2023-03-05 ENCOUNTER — Other Ambulatory Visit: Payer: Self-pay | Admitting: Family Medicine

## 2023-03-05 DIAGNOSIS — Z9189 Other specified personal risk factors, not elsewhere classified: Secondary | ICD-10-CM

## 2023-03-05 NOTE — Progress Notes (Signed)
At risk for osteopenia. Screening DEXA

## 2023-03-09 ENCOUNTER — Ambulatory Visit (HOSPITAL_BASED_OUTPATIENT_CLINIC_OR_DEPARTMENT_OTHER)
Admission: RE | Admit: 2023-03-09 | Discharge: 2023-03-09 | Disposition: A | Discharge status: Home / Self Care | Payer: 59 | Source: Ambulatory Visit | Attending: Family Medicine | Admitting: Family Medicine

## 2023-03-09 DIAGNOSIS — Z9189 Other specified personal risk factors, not elsewhere classified: Secondary | ICD-10-CM | POA: Insufficient documentation

## 2023-04-04 ENCOUNTER — Encounter: Payer: Self-pay | Admitting: Psychiatry

## 2024-03-11 ENCOUNTER — Other Ambulatory Visit: Payer: Self-pay | Admitting: Family Medicine

## 2024-03-11 DIAGNOSIS — I2510 Atherosclerotic heart disease of native coronary artery without angina pectoris: Secondary | ICD-10-CM

## 2024-03-11 NOTE — Progress Notes (Signed)
 HLD an father w/ CAD in early 31s.  CAC screening study ordered.

## 2024-04-02 ENCOUNTER — Ambulatory Visit (HOSPITAL_BASED_OUTPATIENT_CLINIC_OR_DEPARTMENT_OTHER)
Admission: RE | Admit: 2024-04-02 | Discharge: 2024-04-02 | Disposition: A | Discharge status: Home / Self Care | Payer: Self-pay | Source: Ambulatory Visit | Attending: Family Medicine | Admitting: Family Medicine

## 2024-04-02 DIAGNOSIS — I2510 Atherosclerotic heart disease of native coronary artery without angina pectoris: Secondary | ICD-10-CM | POA: Insufficient documentation
